# Patient Record
Sex: Female | Born: 1970 | Race: White | Hispanic: No | Smoking: Current every day smoker
Health system: Southern US, Community
[De-identification: ages and names within clinical notes are randomized; demographics above are authoritative.]

## PROBLEM LIST (undated history)

## (undated) DIAGNOSIS — T7840XA Allergy, unspecified, initial encounter: Secondary | ICD-10-CM

## (undated) DIAGNOSIS — E119 Type 2 diabetes mellitus without complications: Secondary | ICD-10-CM

## (undated) DIAGNOSIS — E079 Disorder of thyroid, unspecified: Secondary | ICD-10-CM

## (undated) DIAGNOSIS — J45909 Unspecified asthma, uncomplicated: Secondary | ICD-10-CM

## (undated) DIAGNOSIS — I639 Cerebral infarction, unspecified: Secondary | ICD-10-CM

## (undated) DIAGNOSIS — J449 Chronic obstructive pulmonary disease, unspecified: Secondary | ICD-10-CM

## (undated) DIAGNOSIS — K219 Gastro-esophageal reflux disease without esophagitis: Secondary | ICD-10-CM

## (undated) DIAGNOSIS — I10 Essential (primary) hypertension: Secondary | ICD-10-CM

## (undated) HISTORY — DX: Allergy, unspecified, initial encounter: T78.40XA

## (undated) HISTORY — DX: Essential (primary) hypertension: I10

## (undated) HISTORY — DX: Cerebral infarction, unspecified: I63.9

## (undated) HISTORY — PX: NASAL SINUS SURGERY: SHX719

## (undated) HISTORY — DX: Type 2 diabetes mellitus without complications: E11.9

## (undated) HISTORY — DX: Gastro-esophageal reflux disease without esophagitis: K21.9

## (undated) HISTORY — DX: Disorder of thyroid, unspecified: E07.9

## (undated) HISTORY — PX: BILATERAL KNEE ARTHROSCOPY: SUR91

## (undated) HISTORY — PX: OTHER SURGICAL HISTORY: SHX169

## (undated) HISTORY — DX: Unspecified asthma, uncomplicated: J45.909

---
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
## 2016-09-16 HISTORY — PX: ABDOMINAL HYSTERECTOMY: SHX81

## 2017-09-21 DIAGNOSIS — T783XXA Angioneurotic edema, initial encounter: Secondary | ICD-10-CM | POA: Diagnosis not present

## 2017-09-21 DIAGNOSIS — R079 Chest pain, unspecified: Secondary | ICD-10-CM | POA: Diagnosis not present

## 2017-09-22 DIAGNOSIS — R079 Chest pain, unspecified: Secondary | ICD-10-CM | POA: Diagnosis not present

## 2017-09-22 DIAGNOSIS — T783XXA Angioneurotic edema, initial encounter: Secondary | ICD-10-CM | POA: Diagnosis not present

## 2018-12-26 DIAGNOSIS — R079 Chest pain, unspecified: Secondary | ICD-10-CM

## 2018-12-26 DIAGNOSIS — E876 Hypokalemia: Secondary | ICD-10-CM | POA: Diagnosis not present

## 2018-12-27 DIAGNOSIS — E876 Hypokalemia: Secondary | ICD-10-CM | POA: Diagnosis not present

## 2018-12-27 DIAGNOSIS — R079 Chest pain, unspecified: Secondary | ICD-10-CM | POA: Diagnosis not present

## 2018-12-28 DIAGNOSIS — E876 Hypokalemia: Secondary | ICD-10-CM | POA: Diagnosis not present

## 2018-12-28 DIAGNOSIS — R079 Chest pain, unspecified: Secondary | ICD-10-CM | POA: Diagnosis not present

## 2020-02-07 ENCOUNTER — Encounter: Payer: Self-pay | Admitting: Physical Medicine and Rehabilitation

## 2020-02-23 ENCOUNTER — Encounter: Payer: 59 | Attending: Physical Medicine and Rehabilitation | Admitting: Physical Medicine and Rehabilitation

## 2020-02-23 ENCOUNTER — Other Ambulatory Visit: Payer: Self-pay

## 2020-02-23 ENCOUNTER — Encounter: Payer: Self-pay | Admitting: Physical Medicine and Rehabilitation

## 2020-02-23 VITALS — BP 144/86 | HR 68 | Temp 97.5°F | Ht 65.5 in | Wt 225.8 lb

## 2020-02-23 DIAGNOSIS — M5440 Lumbago with sciatica, unspecified side: Secondary | ICD-10-CM | POA: Insufficient documentation

## 2020-02-23 DIAGNOSIS — M545 Low back pain: Secondary | ICD-10-CM | POA: Diagnosis not present

## 2020-02-23 DIAGNOSIS — R29898 Other symptoms and signs involving the musculoskeletal system: Secondary | ICD-10-CM | POA: Insufficient documentation

## 2020-02-23 DIAGNOSIS — G8929 Other chronic pain: Secondary | ICD-10-CM | POA: Insufficient documentation

## 2020-02-23 MED ORDER — METAXALONE 800 MG PO TABS: 800.0000 mg | ORAL_TABLET | Freq: Three times a day (TID) | ORAL | 5 refills | Status: DC | PRN

## 2020-02-23 MED ORDER — DULOXETINE HCL 30 MG PO CPEP
30.0000 mg | ORAL_CAPSULE | Freq: Every day | ORAL | 3 refills | Status: DC
Start: 2020-02-23 — End: 2020-05-24
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
## 2020-02-23 NOTE — Patient Instructions (Signed)
Pt is a 49 yr old female with DM A1c of 7.4, hypothyroidism- TSH of 5.4, and HTN and tobacco abuse- 1ppd- here for chronic low back pain.    1. Has tried and failed Flexeril in past- and Baclofen- both are sedating and not real helpful . Will d/c Baclofen and start Skelaxin/Metaxalone- 800 mg up to 3x/day as needed (suggest taking 4x/week/ on bad days).   2. Wean Gabapentin-  Doesn't notice a difference if misses pills.  - decrease to 2 tabs of 300 mg (600) 3x/day x 1 week then 300 mg 3x/day x 1 week, then 1 tab nightly x 3-4 days, then stop.   3.  Duloxetine /Cymbalta 30 mg nightly x 1 week  Then 60 mg nightly- for nerve pain  1% of patients can have nausea with Duloxetine- call me if needs an anti-nausea medicine. Can also cause mild dry mouth/dry eyes and mild constipation.  4. If not effective, will change to Lyrica.   5. I suggest if fatigue and hair loss and dry skin don't improve pretty fast, talk to PCP about that with thyroid.   6. Referral to PT- [place] for PT only- outpatient.   7. F/U in 6 weeks- call if issues earlier.

## 2020-04-05 ENCOUNTER — Other Ambulatory Visit: Payer: Self-pay

## 2020-04-05 ENCOUNTER — Encounter: Payer: Self-pay | Admitting: Physical Medicine and Rehabilitation

## 2020-04-05 ENCOUNTER — Encounter: Payer: 59 | Attending: Physical Medicine and Rehabilitation | Admitting: Physical Medicine and Rehabilitation

## 2020-04-05 VITALS — BP 126/86 | HR 84 | Temp 98.3°F | Ht 65.5 in | Wt 214.0 lb

## 2020-04-05 DIAGNOSIS — M62838 Other muscle spasm: Secondary | ICD-10-CM

## 2020-04-05 DIAGNOSIS — G8929 Other chronic pain: Secondary | ICD-10-CM

## 2020-04-05 DIAGNOSIS — M792 Neuralgia and neuritis, unspecified: Secondary | ICD-10-CM

## 2020-04-05 DIAGNOSIS — R29898 Other symptoms and signs involving the musculoskeletal system: Secondary | ICD-10-CM | POA: Insufficient documentation

## 2020-04-05 DIAGNOSIS — M545 Low back pain: Secondary | ICD-10-CM

## 2020-04-05 MED ORDER — PREGABALIN 50 MG PO CAPS: 50.0000 mg | ORAL_CAPSULE | Freq: Three times a day (TID) | ORAL | 5 refills | Status: DC

## 2020-04-05 MED ORDER — ORPHENADRINE CITRATE ER 100 MG PO TB12
100.0000 mg | ORAL_TABLET | Freq: Two times a day (BID) | ORAL | 5 refills | Status: DC | PRN
Start: 2020-04-05 — End: 2020-12-20

## 2020-04-05 MED ORDER — LEVOTHYROXINE SODIUM 75 MCG PO TABS
75.0000 ug | ORAL_TABLET | Freq: Every day | ORAL | 5 refills | Status: DC
Start: 2020-04-05 — End: 2020-10-18

## 2020-04-05 NOTE — Patient Instructions (Addendum)
1. Wean off Duloxetine- 30 mg daily x 1 week, then stop- if during this, pain gets worse, can restart 60 mg daily- just in case.    2. Lyrica (is off Gabapentin)-  50 mg 3x/day x 1 week, then 100 mg 3x/day- for low back pain, nerve pain - can take 1 tab in AM and 2 tabs at night, or just 3x/day.  Can cause LE swelling (different mechanism), sluggish,sleepy and depression in rare cases.   3. Starts PT next week on 7/26-focus on doing 5 DAYS/week  4.  Orphenadrine/Norflex 100 mg 2x/day AS NEEDED for muscle relaxant. - also can cause sleepiness.  5. Synthroid 75 mcg daily-for thyroid replacement- stop Euthyrox.    6. F/U in 6-8 weeks

## 2020-04-05 NOTE — Progress Notes (Addendum)
Subjective:    Patient ID: Kimberly Davila, female    DOB: 16-Oct-1970, 49 y.o.   MRN: [id number]  HPI   Pt is a 49 yr old female with DM A1c of 7.4, hypothyroidism- TSH of 5.4, and HTN and tobacco abuse- 1ppd- here for chronic low back pain.  Here for f/u today.   On Euthyrox 25 mcg daily- but on LOW dose and didn't start Synthroid - which is unusual.      Got Duloxetine- but insurance denied Skelaxin completely, even with preauthorization.   No side effects from Duloxetine-  Taking 2 capsules/day- so 60 mg daily- so 6 weeks since started it.   Thinks had an MRI done-has CD- no report.    Had severe LE swelling from Gabapentin- swelled "huge" and hurt really bad.   Pain didn't get worse when came off Gabapentin.   Dr Beverely Pace is back doctor- did epidural steroid injections- 8+ times and no improvement.   Has had tramadol in past- never did anything for her.     Pain Inventory Average Pain 9 Pain Right Now 8 My pain is constant, sharp, burning and aching  In the last 24 hours, has pain interfered with the following? General activity 4 Relation with others 2 Enjoyment of life 6 What TIME of day is your pain at its worst? All the time around the clock. Sleep (in general) Poor  Pain is worse with: walking, bending, sitting and standing Pain improves with: medication Relief from Meds: 3  Mobility walk without assistance how many minutes can you walk? 5-10 mins ability to climb steps?  no do you drive?  yes  Function what is your job? Out of work since [place]. I need assistance with the following:  household duties Do you have any goals in this area?  yes  Neuro/Psych trouble walking spasms  Prior Studies Any changes since last visit?  no  Physicians involved in your care Any changes since last visit?  no   History reviewed. No pertinent family history. Social History   Socioeconomic History  . Marital status: Divorced    Spouse name: Not on file   . Number of children: Not on file  . Years of education: Not on file  . Highest education level: Not on file  Occupational History  . Not on file  Tobacco Use  . Smoking status: Current Every Day Smoker    Packs/day: 1.00    Years: 26.00    Pack years: 26.00    Types: Cigarettes  . Smokeless tobacco: Never Used  Substance and Sexual Activity  . Alcohol use: Not on file  . Drug use: Not on file  . Sexual activity: Not on file  Other Topics Concern  . Not on file  Social History Narrative  . Not on file   Social Determinants of Health   Financial Resource Strain:   . Difficulty of Paying Living Expenses:   Food Insecurity:   . Worried About Charity fundraiser in the Last Year:   . Arboriculturist in the Last Year:   Transportation Needs:   . Film/video editor (Medical):   Marland Kitchen Lack of Transportation (Non-Medical):   Physical Activity:   . Days of Exercise per Week:   . Minutes of Exercise per Session:   Stress:   . Feeling of Stress :   Social Connections:   . Frequency of Communication with Friends and Family:   . Frequency of Social Gatherings with Friends  and Family:   . Attends Religious Services:   . Active Member of Clubs or Organizations:   . Attends Archivist Meetings:   Marland Kitchen Marital Status:    Past Surgical History:  Procedure Laterality Date  . ABDOMINAL HYSTERECTOMY  2018  . BILATERAL KNEE ARTHROSCOPY    . CHOLECYSTECTOMY  1991   Past Medical History:  Diagnosis Date  . Allergy   . Diabetes mellitus without complication ([place])   . Hypertension   . Thyroid disease    BP 126/86   Pulse 84   Temp 98.3 F (36.8 C)   Ht 5' 5.5" (1.664 m)   Wt 214 lb (97.1 kg)   SpO2 95%   BMI 35.07 kg/m   Opioid Risk Score:   Fall Risk Score:  `1  Depression screen PHQ 2/9  Depression screen PHQ 2/9 02/23/2020  Decreased Interest 3  Down, Depressed, Hopeless 1  PHQ - 2 Score 4  Altered sleeping 3  Tired, decreased energy 2  Change in appetite  1  Feeling bad or failure about yourself  2  Trouble concentrating 1  Moving slowly or fidgety/restless 0  Suicidal thoughts 0  PHQ-9 Score 13    Review of Systems  Constitutional: Negative.   HENT: Negative.   Eyes: Negative.   Cardiovascular: Negative.   Gastrointestinal: Negative.   Endocrine: Negative.   Genitourinary: Negative.   Musculoskeletal: Positive for gait problem.       Spasms  Skin: Negative.   Allergic/Immunologic: Negative.   Hematological: Negative.   Psychiatric/Behavioral: Negative.        Objective:   Physical Exam   Awake, alert, appropriate, wearing mask, sitting on table, NAD Downcast expression- keeping very still due to pain.      Assessment & Plan:   Pt is a 49 yr old female with DM A1c of 7.4. diabetic neuropathy,, hypothyroidism- TSH of 5.4, and HTN and tobacco abuse- 1ppd- here for chronic low back pain.  Here for f/u today   1. Wean off Duloxetine- 30 mg daily x 1 week, then stop- if during this, pain gets worse, can restart 60 mg daily- just in case.    2. Lyrica (is off Gabapentin)-  50 mg 3x/day x 1 week, then 100 mg 3x/day- for low back pain, nerve pain - can take 1 tab in AM and 2 tabs at night, or just 3x/day.  Can cause LE swelling (different mechanism), sluggish,sleepy and depression in rare cases.  Also spoke to pt again when said insurance only covers Lyrica for diabetic neuropathy- she explained she has DM- type II- A1c 7.6 and also c/o burning, numbness/tingling in feet/legs- so meets criteria for diabetic neuropathy as well as chronic back pain with radiculopathy - will submit appeal back to insurance for Lyrica to be covered- since she meets the insurance criteria for this medication.   3. Starts PT next week on 7/26-  4.  Orphenadrine/Norflex 100 mg 2x/day AS NEEDED for muscle relaxant. - also can cause sleepiness.   5. F/U in 6-8 weeks  6.  Synthroid 75 mcg daily-for thyroid replacement- stop Euthyrox.   I spent a  total of 25 minutes on appointment- as detailed above.

## 2020-04-10 ENCOUNTER — Ambulatory Visit: Payer: 59 | Attending: Physical Medicine and Rehabilitation | Admitting: Physical Therapy

## 2020-04-10 ENCOUNTER — Other Ambulatory Visit: Payer: Self-pay

## 2020-04-10 DIAGNOSIS — M62830 Muscle spasm of back: Secondary | ICD-10-CM | POA: Insufficient documentation

## 2020-04-10 DIAGNOSIS — M6281 Muscle weakness (generalized): Secondary | ICD-10-CM | POA: Diagnosis present

## 2020-04-10 DIAGNOSIS — G8929 Other chronic pain: Secondary | ICD-10-CM | POA: Diagnosis present

## 2020-04-10 DIAGNOSIS — R262 Difficulty in walking, not elsewhere classified: Secondary | ICD-10-CM | POA: Diagnosis present

## 2020-04-10 DIAGNOSIS — M545 Low back pain, unspecified: Secondary | ICD-10-CM

## 2020-04-10 NOTE — Patient Instructions (Addendum)
    Home exercise program created by Kayden Amend, PT.  For questions, please contact Makinsley Schiavi via phone at [phone] or email at [email or web address]  [facility] [place] [address]  [address] [place], [place], [address] Phone: [phone]   Fax:  [phone]   Trigger Point Dry Needling  . What is Trigger Point Dry Needling (DN)? o DN is a physical therapy technique used to treat muscle pain and dysfunction. Specifically, DN helps deactivate muscle trigger points (muscle knots).  o A thin filiform needle is used to penetrate the skin and stimulate the underlying trigger point. The goal is for a local twitch response (LTR) to occur and for the trigger point to relax. No medication of any kind is injected during the procedure.   . What Does Trigger Point Dry Needling Feel Like?  o The procedure feels different for each individual patient. Some patients report that they do not actually feel the needle enter the skin and overall the process is not painful. Very mild bleeding may occur. However, many patients feel a deep cramping in the muscle in which the needle was inserted. This is the local twitch response.   . How Will I feel after the treatment? o Soreness is normal, and the onset of soreness may not occur for a few hours. Typically this soreness does not last longer than two days.  o Bruising is uncommon, however; ice can be used to decrease any possible bruising.  o In rare cases feeling tired or nauseous after the treatment is normal. In addition, your symptoms may get worse before they get better, this period will typically not last longer than 24 hours.   . What Can I do After My Treatment? o Increase your hydration by drinking more water for the next 24 hours. o You may place ice or heat on the areas treated that have become sore, however, do not use heat on inflamed or bruised areas. Heat often brings more relief post  needling. o You can continue your regular activities, but vigorous activity is not recommended initially after the treatment for 24 hours. o DN is best combined with other physical therapy such as strengthening, stretching, and other therapies.   
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
## 2020-04-11 ENCOUNTER — Telehealth: Payer: Self-pay | Admitting: *Deleted

## 2020-04-11 NOTE — Telephone Encounter (Signed)
Received incoming fax from [employer]  Prior authorization for pregabalin DENIED.  Denial letter indicates post-herpetic neuralgia and diabetic neuropathy are the only acceptable clinical indications.

## 2020-04-13 NOTE — Telephone Encounter (Signed)
Spoke to pt- made changes to note from 7/21- so should be able to submit to insurance- call me if questions- ML

## 2020-04-17 ENCOUNTER — Other Ambulatory Visit: Payer: Self-pay

## 2020-04-17 ENCOUNTER — Ambulatory Visit: Payer: 59 | Attending: Physical Medicine and Rehabilitation | Admitting: Physical Therapy

## 2020-04-17 DIAGNOSIS — M545 Low back pain: Secondary | ICD-10-CM | POA: Insufficient documentation

## 2020-04-17 DIAGNOSIS — M6281 Muscle weakness (generalized): Secondary | ICD-10-CM

## 2020-04-17 DIAGNOSIS — G8929 Other chronic pain: Secondary | ICD-10-CM

## 2020-04-17 DIAGNOSIS — R262 Difficulty in walking, not elsewhere classified: Secondary | ICD-10-CM | POA: Diagnosis present

## 2020-04-17 DIAGNOSIS — M62830 Muscle spasm of back: Secondary | ICD-10-CM

## 2020-04-17 NOTE — Therapy (Signed)
[facility] [address]  [place], [place], [address] Phone: [phone]   Fax:  [phone]  Physical Therapy Treatment  Patient Details  Name: Kimberly Davila MRN: [id number] Date of Birth: 1971/07/11 Referring Provider (PT): Courtney Heys, MD   Encounter Date: 04/17/2020   PT End of Session - 04/17/20 1033    Visit Number 2    Number of Visits 12    Date for PT Re-Evaluation 05/22/20    Authorization Type [employer]    PT Start Time 1031   Pt arrived late   PT Stop Time 1121    PT Time Calculation (min) 50 min    Activity Tolerance Patient tolerated treatment well;Patient limited by pain    Behavior During Therapy [facility] for tasks assessed/performed           Past Medical History:  Diagnosis Date   Allergy    Diabetes mellitus without complication ([place])    Hypertension    Thyroid disease     Past Surgical History:  Procedure Laterality Date   ABDOMINAL HYSTERECTOMY  2018   BILATERAL KNEE ARTHROSCOPY     CHOLECYSTECTOMY  1991    There were no vitals filed for this visit.   Subjective Assessment - 04/17/20 1034    Subjective Pt reporting increased pain following HEP - she thinks it may be from the seated prayer stretches.    Patient Stated Goals "stop having pain"    Currently in Pain? Yes    Pain Score 6     Pain Location Back    Pain Orientation Lower    Pain Descriptors / Indicators Aching;Burning    Pain Type Chronic pain    Pain Frequency Constant                             [facility] Adult PT Treatment/Exercise - 04/17/20 1031      Self-Care   Self-Care Posture    Posture Provided education in typical daily positoning, mobility and daily tasks to minimize strain on lumbar spine - pt noting seh already uses some of techniques detailed but did note some good ideas she would like to try.      Exercises   Exercises Lumbar      Lumbar Exercises: Stretches    Passive Hamstring Stretch Right;Left;30 seconds;1 rep    Passive Hamstring Stretch Limitations supine with strap    Single Knee to Chest Stretch Right;Left;30 seconds;1 rep    Single Knee to Chest Stretch Limitations opp knee flexed    Piriformis Stretch Right;Left;30 seconds;1 rep    Piriformis Stretch Limitations supine KTOS   reports pain in tailbone with stretch on R     Lumbar Exercises: Aerobic   Nustep L4 x 4 min (UE/LE)   seat #7     Lumbar Exercises: Supine   Pelvic Tilt 10 reps;5 seconds      Modalities   Modalities Electrical Stimulation;Moist Heat      Moist Heat Therapy   Number Minutes Moist Heat 15 Minutes    Moist Heat Location Lumbar Spine      Electrical Stimulation   Electrical Stimulation Location Lumbar paraspinals    Electrical Stimulation Action IFC    Electrical Stimulation Parameters 80-150 Hz, intensity to pt tol x 15'    Electrical Stimulation Goals Pain;Tone                  PT  Education - 04/17/20 1100    Education Details Posture & body mechanics education    Person(s) Educated Patient    Methods Explanation;Demonstration;Handout    Comprehension Verbalized understanding;Need further instruction            PT Short Term Goals - 04/17/20 1034      PT SHORT TERM GOAL #1   Title Patient will be independent with initial HEP    Status On-going    Target Date 04/24/20      PT SHORT TERM GOAL #2   Title Patient will verbalize/demonstrate good awareness of neutral spine posture and proper body mechanics for daily tasks    Status On-going    Target Date 05/01/20      PT SHORT TERM GOAL #3   Title Patient to improve tissue quality as noted by reduced tissue tightness and tenderness to palpation    Status On-going    Target Date 05/01/20             PT Long Term Goals - 04/17/20 1044      PT LONG TERM GOAL #1   Title Patient will be independent with ongoing/advanced HEP    Status On-going    Target Date 05/22/20      PT  LONG TERM GOAL #2   Title Patient to demonstrate ability to achieve and maintain good spinal alignment/posturing    Status On-going    Target Date 05/22/20      PT LONG TERM GOAL #3   Title Patient to report pain reduction in frequency and intensity by >/= 50%    Status On-going    Target Date 05/22/20      PT LONG TERM GOAL #4   Title Patient to improve lumbar AROM to [facility] without pain provocation    Status On-going    Target Date 05/22/20      PT LONG TERM GOAL #5   Title Patient will demonstrate improved B proximal LE strength to >/= 4+/5 for improved stability and ease of mobility    Status On-going    Target Date 05/22/20      PT LONG TERM GOAL #6   Title Patient to report ability to perform ADLs and household tasks/chores without increased pain >3/10    Status On-going    Target Date 05/22/20                 Plan - 04/17/20 1044    Clinical Impression Statement Kimberly Davila reports increased pain with HEP - she is uncertain which exercise is triggering the increase in pain but thinks it may be resulting from seated prayer stretches. HEP reviewed excluding seated prayer stretches with no increased pain reported with all supine exercises other than KTOS on R which caused increased pain in tailbone. SIJ assessed for asymmetry but alignment appears WNL. Session limited due to patients late arrival, with remainder of session focusing on education in proper posture and body mechanics for typical daily positioning and tasks.    Personal Factors and Comorbidities Time since onset of injury/illness/exacerbation;Past/Current Experience;Fitness;Comorbidity 3+    Comorbidities DM, HTN, thyroid disease, B knee arthroscopy    Examination-Activity Limitations Bed Mobility;Bend;Lift;Carry;Dressing;Hygiene/Grooming;Locomotion Level;Sit;Sleep;Squat;Stairs;Stand;Transfers    Examination-Participation Restrictions Cleaning;Community Activity;Driving;Laundry;Meal Prep;Shop;Yard Work;Other   out  of work since injury   Rehab Potential Good    PT Frequency 2x / week    PT Duration 6 weeks    PT Treatment/Interventions ADLs/Self Care Home Management;Cryotherapy;Electrical Stimulation;Iontophoresis 4mg /ml Dexamethasone;Moist Heat;Traction;Ultrasound;Gait training;Functional mobility training;Therapeutic activities;Therapeutic exercise;Neuromuscular re-education;Patient/family  education;Manual techniques;Passive range of motion;Dry needling;Taping;Spinal Manipulations    PT Next Visit Plan manual therapy including possible DN to address increase muscle tension/tightness and pain; lumbopelvic flexibility & strengthening; posture and body mechanics training review PRN; modalities PRN including review of home TENS setup if pt brings unit    PT Home Exercise Plan 7/26 - seated 3-way prayer stretch, supine SKTC, piriformis and HS stretches, pelvic tilt    Consulted and Agree with Plan of Care Patient           Patient will benefit from skilled therapeutic intervention in order to improve the following deficits and impairments:  Abnormal gait, Decreased activity tolerance, Decreased endurance, Decreased mobility, Decreased knowledge of precautions, Decreased range of motion, Decreased safety awareness, Decreased strength, Difficulty walking, Hypomobility, Improper body mechanics, Postural dysfunction, Pain, Impaired perceived functional ability  Visit Diagnosis: Chronic bilateral low back pain without sciatica  Muscle spasm of back  Muscle weakness (generalized)  Difficulty in walking, not elsewhere classified     Problem List Patient Active Problem List   Diagnosis Date Noted   Nerve pain 04/05/2020   Muscle spasms of both lower extremities 04/05/2020   Left leg weakness 02/23/2020    Percival Spanish, PT, MPT 04/17/2020, 4:23 PM  [facility] [address]  [place], [place], [address] Phone: [phone]   Fax:   [phone]  Name: Kimberly Davila MRN: [id number] Date of Birth: 1970-11-18

## 2020-04-17 NOTE — Patient Instructions (Signed)

## 2020-04-20 ENCOUNTER — Ambulatory Visit: Payer: 59

## 2020-04-20 ENCOUNTER — Other Ambulatory Visit: Payer: Self-pay

## 2020-04-20 DIAGNOSIS — G8929 Other chronic pain: Secondary | ICD-10-CM

## 2020-04-20 DIAGNOSIS — M6281 Muscle weakness (generalized): Secondary | ICD-10-CM

## 2020-04-20 DIAGNOSIS — M545 Low back pain: Secondary | ICD-10-CM | POA: Diagnosis not present

## 2020-04-20 DIAGNOSIS — R262 Difficulty in walking, not elsewhere classified: Secondary | ICD-10-CM

## 2020-04-20 DIAGNOSIS — M62830 Muscle spasm of back: Secondary | ICD-10-CM
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
## 2020-04-24 ENCOUNTER — Other Ambulatory Visit: Payer: Self-pay

## 2020-04-24 ENCOUNTER — Ambulatory Visit: Payer: 59 | Admitting: Physical Therapy

## 2020-04-24 ENCOUNTER — Encounter: Payer: Self-pay | Admitting: Physical Therapy

## 2020-04-24 DIAGNOSIS — M545 Low back pain, unspecified: Secondary | ICD-10-CM

## 2020-04-24 DIAGNOSIS — M62830 Muscle spasm of back: Secondary | ICD-10-CM

## 2020-04-24 DIAGNOSIS — R262 Difficulty in walking, not elsewhere classified: Secondary | ICD-10-CM

## 2020-04-24 DIAGNOSIS — M6281 Muscle weakness (generalized): Secondary | ICD-10-CM

## 2020-04-24 NOTE — Patient Instructions (Signed)
   Kinesiology tape  What is kinesiology tape?  There are many brands of kinesiology tape. KTape, Rock Tape, Body Sport, Dynamic tape, to name a few.  It is an elasticized tape designed to support the body's natural healing process. This tape provides stability and support to muscles and joints without restricting motion.  It can also help decrease swelling in the area of application.  How does it work?  The tape microscopically lifts and decompresses the skin to allow for drainage of lymph (swelling) to flow away from area, reducing inflammation. The tape has the ability to help re-educate the neuromuscular system by targeting specific receptors in the skin. The presence of the tape increases the body's awareness of posture and body mechanics.  Do not use with:  . Open wounds . Skin lesions . Adhesive allergies  In some rare cases, mild/moderate skin irritation can occur. This can include redness, itchiness, or hives. If this occurs, immediately remove tape and consult your primary care physician if symptoms are severe or do not resolve within 2 days.  Safe removal of the tape:  To remove tape safely, hold nearby skin with one hand and gentle roll tape down with other hand. You can apply oil or conditioner to tape while in shower prior to removal to loosen adhesive. DO NOT swiftly rip tape off like a band-aid, as this could cause skin tears and additional skin irritation.     For questions, please contact your therapist at:  [facility] [place] [address]  [address] [place], [place], [address] Phone: [phone]   Fax:  [phone]     

## 2020-04-24 NOTE — Telephone Encounter (Signed)
Prior Authorization approved from 04/24/2020-04/24/2021
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
## 2020-04-27 ENCOUNTER — Ambulatory Visit: Payer: 59

## 2020-04-27 ENCOUNTER — Other Ambulatory Visit: Payer: Self-pay

## 2020-04-27 DIAGNOSIS — R262 Difficulty in walking, not elsewhere classified: Secondary | ICD-10-CM

## 2020-04-27 DIAGNOSIS — M545 Low back pain: Secondary | ICD-10-CM | POA: Diagnosis not present

## 2020-04-27 DIAGNOSIS — G8929 Other chronic pain: Secondary | ICD-10-CM

## 2020-04-27 DIAGNOSIS — M62830 Muscle spasm of back: Secondary | ICD-10-CM

## 2020-04-27 DIAGNOSIS — M6281 Muscle weakness (generalized): Secondary | ICD-10-CM
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
## 2020-04-27 NOTE — Patient Instructions (Signed)
TENS stands for Transcutaneous Electrical Nerve Stimulation. In other words, electrical impulses are allowed to pass through the skin in order to excite a nerve.   Purpose and Use of TENS:  TENS is a method used to manage acute and chronic pain without the use of drugs. It has been effective in managing pain associated with surgery, sprains, strains, trauma, rheumatoid arthritis, and neuralgias. It is a non-addictive, low risk, and non-invasive technique used to control pain. It is not, by any means, a curative form of treatment.   How TENS Works:  Most TENS units are a Paramedic unit powered by one 9 volt battery. Attached to the outside of the unit are two lead wires where two pins and/or snaps connect on each wire. All units come with a set of four reusable pads or electrodes. These are placed on the skin surrounding the area involved. By inserting the leads into  the pads, the electricity can pass from the unit making the circuit complete.  As the intensity is turned up slowly, the electrical current enters the body from the electrodes through the skin to the surrounding nerve fibers. This triggers the release of hormones from within the body. These hormones contain pain relievers. By increasing the circulation of these hormones, the persons pain may be lessened. It is also believed that the electrical stimulation itself helps to block the pain messages being sent to the brain, thus also decreasing the bodys perception of pain.   Hazards:  TENS units are NOT to be used by patients with PACEMAKERS, DEFIBRILLATORS, DIABETIC PUMPS, PREGNANT WOMEN, and patients with SEIZURE DISORDERS.  TENS units are NOT to be used over the heart, throat, brain, or spinal cord.  One of the major side effects from the TENS unit may be skin irritation. Some people may develop a rash if they are sensitive to the materials used in the electrodes or the connecting wires.   Wear the unit for 15 min.   Avoid  overuse due the body getting used to the stem making it not as effective over time.

## 2020-05-02 ENCOUNTER — Other Ambulatory Visit: Payer: Self-pay

## 2020-05-02 ENCOUNTER — Ambulatory Visit: Payer: 59

## 2020-05-02 DIAGNOSIS — M6281 Muscle weakness (generalized): Secondary | ICD-10-CM

## 2020-05-02 DIAGNOSIS — G8929 Other chronic pain: Secondary | ICD-10-CM

## 2020-05-02 DIAGNOSIS — M545 Low back pain, unspecified: Secondary | ICD-10-CM

## 2020-05-02 DIAGNOSIS — M62830 Muscle spasm of back: Secondary | ICD-10-CM

## 2020-05-02 DIAGNOSIS — R262 Difficulty in walking, not elsewhere classified: Secondary | ICD-10-CM
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
## 2020-05-04 ENCOUNTER — Encounter: Payer: 59 | Admitting: Physical Therapy

## 2020-05-24 ENCOUNTER — Encounter: Payer: Self-pay | Admitting: Physical Medicine and Rehabilitation

## 2020-05-24 ENCOUNTER — Encounter: Payer: 59 | Attending: Physical Medicine and Rehabilitation | Admitting: Physical Medicine and Rehabilitation

## 2020-05-24 ENCOUNTER — Other Ambulatory Visit: Payer: Self-pay

## 2020-05-24 VITALS — BP 122/82 | HR 72 | Temp 98.5°F | Ht 65.5 in | Wt 215.6 lb

## 2020-05-24 DIAGNOSIS — M62838 Other muscle spasm: Secondary | ICD-10-CM

## 2020-05-24 DIAGNOSIS — M545 Low back pain: Secondary | ICD-10-CM | POA: Diagnosis not present

## 2020-05-24 DIAGNOSIS — M792 Neuralgia and neuritis, unspecified: Secondary | ICD-10-CM | POA: Diagnosis not present

## 2020-05-24 DIAGNOSIS — R29898 Other symptoms and signs involving the musculoskeletal system: Secondary | ICD-10-CM

## 2020-05-24 DIAGNOSIS — G8929 Other chronic pain: Secondary | ICD-10-CM | POA: Insufficient documentation

## 2020-05-24 MED ORDER — LEVETIRACETAM 500 MG PO TABS: 500.0000 mg | ORAL_TABLET | Freq: Two times a day (BID) | ORAL | 5 refills | Status: DC

## 2020-05-24 NOTE — Patient Instructions (Signed)
1. Think pt will have care for awhile.- at least another 1 year.    2. Keppra- 500 mg 2x/day x 1 week, then 1000 mg 2x/day- for back pain-   3. 3% can have irritability- 50% can respond well to Vitamin B complex (over the counter)- take as directed on bottle. If gets irritable.   4. Discussed opiates- will wait sinc will not treat the burning part of pain.   5. Discussed options of Trileptal and low dose naltrexone as well- decided upon Keppra for now.   6. If need be, will do any prior authorization needed.   7. F/U in 4 weeks- call if any issues. [phone]- - if need to change meds, can do on phone.
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
## 2020-05-30 ENCOUNTER — Encounter: Payer: Self-pay | Admitting: Physician Assistant

## 2020-06-21 ENCOUNTER — Other Ambulatory Visit: Payer: Self-pay

## 2020-06-21 ENCOUNTER — Encounter: Payer: 59 | Attending: Physical Medicine and Rehabilitation | Admitting: Physical Medicine and Rehabilitation

## 2020-06-21 ENCOUNTER — Encounter: Payer: Self-pay | Admitting: Physical Medicine and Rehabilitation

## 2020-06-21 VITALS — BP 121/78 | HR 82 | Temp 98.5°F | Ht 65.5 in | Wt 213.4 lb

## 2020-06-21 DIAGNOSIS — M5440 Lumbago with sciatica, unspecified side: Secondary | ICD-10-CM | POA: Diagnosis not present

## 2020-06-21 DIAGNOSIS — M62838 Other muscle spasm: Secondary | ICD-10-CM | POA: Diagnosis not present

## 2020-06-21 DIAGNOSIS — G8929 Other chronic pain: Secondary | ICD-10-CM | POA: Diagnosis not present

## 2020-06-21 DIAGNOSIS — M545 Low back pain, unspecified: Secondary | ICD-10-CM | POA: Diagnosis present

## 2020-06-21 DIAGNOSIS — E1140 Type 2 diabetes mellitus with diabetic neuropathy, unspecified: Secondary | ICD-10-CM | POA: Diagnosis not present

## 2020-06-21 DIAGNOSIS — S01101A Unspecified open wound of right eyelid and periocular area, initial encounter: Secondary | ICD-10-CM

## 2020-06-21 DIAGNOSIS — Z79899 Other long term (current) drug therapy: Secondary | ICD-10-CM | POA: Diagnosis not present

## 2020-06-21 DIAGNOSIS — M792 Neuralgia and neuritis, unspecified: Secondary | ICD-10-CM

## 2020-06-21 DIAGNOSIS — I10 Essential (primary) hypertension: Secondary | ICD-10-CM | POA: Diagnosis not present

## 2020-06-21 DIAGNOSIS — E039 Hypothyroidism, unspecified: Secondary | ICD-10-CM | POA: Diagnosis not present

## 2020-06-21 DIAGNOSIS — F17210 Nicotine dependence, cigarettes, uncomplicated: Secondary | ICD-10-CM | POA: Diagnosis not present

## 2020-06-21 DIAGNOSIS — S0011XA Contusion of right eyelid and periocular area, initial encounter: Secondary | ICD-10-CM | POA: Insufficient documentation

## 2020-06-21 MED ORDER — LEVETIRACETAM 1000 MG PO TABS: 1000.0000 mg | ORAL_TABLET | Freq: Two times a day (BID) | ORAL | 5 refills | Status: DC
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
## 2020-06-29 ENCOUNTER — Ambulatory Visit (INDEPENDENT_AMBULATORY_CARE_PROVIDER_SITE_OTHER): Payer: 59 | Admitting: Physician Assistant

## 2020-06-29 ENCOUNTER — Encounter: Payer: Self-pay | Admitting: Physician Assistant

## 2020-06-29 DIAGNOSIS — K582 Mixed irritable bowel syndrome: Secondary | ICD-10-CM | POA: Diagnosis not present

## 2020-06-29 DIAGNOSIS — K219 Gastro-esophageal reflux disease without esophagitis: Secondary | ICD-10-CM | POA: Diagnosis not present

## 2020-06-29 DIAGNOSIS — Z1211 Encounter for screening for malignant neoplasm of colon: Secondary | ICD-10-CM | POA: Diagnosis not present

## 2020-06-29 DIAGNOSIS — R1013 Epigastric pain: Secondary | ICD-10-CM

## 2020-06-29 MED ORDER — OMEPRAZOLE 40 MG PO CPDR: 40.0000 mg | DELAYED_RELEASE_CAPSULE | Freq: Two times a day (BID) | ORAL | 3 refills | Status: AC

## 2020-06-29 MED ORDER — SUPREP BOWEL PREP KIT 17.5-3.13-1.6 GM/177ML PO SOLN: 1.0000 | ORAL | 0 refills | Status: DC

## 2020-06-29 NOTE — Progress Notes (Signed)
Chief Complaint: Abdominal pain, GERD  HPI:    Kimberly Davila is a 49 year old female with a past medical history as listed below, who was referred to me by Imagene Riches, NP for a complaint of abdominal pain and GERD.      Today, the patient explains that she had epigastric pain a few months ago that was so terrible and sharp, rated as a 10/10, that she could not eat for at least a month, this was regardless of using her Omeprazole 20 mg twice daily which she has been on for years.  Her PCP apparently increase this to 40 mg daily and patient tells me that after a month the pain seemed to go away and she has been able to eat over the past month now but does continue with reflux symptoms, especially if she forgets to take her medicine and sometimes at night which wakes her from her sleep choking.  This happens about 2 to 3 days a week.    Also complains of bowel movements which radiate back-and-forth from constipation to diarrhea, tells me she was diagnosed with IBS years ago.    Denies fever, chills, weight loss, change in bowel habits or blood in her stool.     Past Medical History:  Diagnosis Date  . Allergy   . Asthma   . Diabetes mellitus without complication ([place])   . Hypertension   . Thyroid disease     Past Surgical History:  Procedure Laterality Date  . ABDOMINAL HYSTERECTOMY  2018  . BILATERAL KNEE ARTHROSCOPY    . CHOLECYSTECTOMY  1991    Current Outpatient Medications  Medication Sig Dispense Refill  . albuterol (VENTOLIN HFA) 108 (90 Base) MCG/ACT inhaler INHALE 2 PUFFS BY MOUTH 4 TIMES DAILY FOR 5 DAYS THEN AS NEEDED    . amLODipine (NORVASC) 10 MG tablet Take 10 mg by mouth daily.    Marland Kitchen atorvastatin (LIPITOR) 10 MG tablet Take 10 mg by mouth daily.    . famotidine (PEPCID) 20 MG tablet Take 20 mg by mouth 2 (two) times daily.    Marland Kitchen glyBURIDE (DIABETA) 5 MG tablet Take 5 mg by mouth 2 (two) times daily.    . hydrochlorothiazide (HYDRODIURIL) 25 MG tablet Take 25 mg by  mouth daily.    Marland Kitchen levETIRAcetam (KEPPRA) 1000 MG tablet Take 1 tablet (1,000 mg total) by mouth 2 (two) times daily. For nerve pain 60 tablet 5  . levothyroxine (SYNTHROID) 75 MCG tablet Take 1 tablet (75 mcg total) by mouth daily before breakfast. 30 tablet 5  . losartan (COZAAR) 25 MG tablet Take 25 mg by mouth daily.    . magnesium oxide (MAG-OX) 400 MG tablet Take 1 tablet by mouth daily.    . metFORMIN (GLUCOPHAGE) 500 MG tablet Take 1 tablet by mouth 2 (two) times daily.    Marland Kitchen omeprazole (PRILOSEC) 20 MG capsule Take 10 mg by mouth daily.     . orphenadrine (NORFLEX) 100 MG tablet Take 1 tablet (100 mg total) by mouth 2 (two) times daily as needed for muscle spasms. 60 tablet 5  . VITAMIN D PO Take 5,000 Units by mouth daily.     No current facility-administered medications for this visit.    Allergies as of 06/29/2020 - Review Complete 06/29/2020  Allergen Reaction Noted  . Aspirin Anaphylaxis and Swelling 04/30/2016  . Sulfa antibiotics Rash 07/10/2015    Family History  Problem Relation Age of Onset  . Diabetes Mother   . Diabetes  Father   . Diabetes Maternal Grandmother     Social History   Socioeconomic History  . Marital status: Divorced    Spouse name: Not on file  . Number of children: Not on file  . Years of education: Not on file  . Highest education level: Not on file  Occupational History  . Not on file  Tobacco Use  . Smoking status: Current Every Day Smoker    Packs/day: 1.00    Years: 26.00    Pack years: 26.00    Types: Cigarettes  . Smokeless tobacco: Never Used  Vaping Use  . Vaping Use: Never used  Substance and Sexual Activity  . Alcohol use: Yes  . Drug use: Not Currently  . Sexual activity: Not on file  Other Topics Concern  . Not on file  Social History Narrative  . Not on file   Social Determinants of Health   Financial Resource Strain:   . Difficulty of Paying Living Expenses: Not on file  Food Insecurity:   . Worried About  Charity fundraiser in the Last Year: Not on file  . Ran Out of Food in the Last Year: Not on file  Transportation Needs:   . Lack of Transportation (Medical): Not on file  . Lack of Transportation (Non-Medical): Not on file  Physical Activity:   . Days of Exercise per Week: Not on file  . Minutes of Exercise per Session: Not on file  Stress:   . Feeling of Stress : Not on file  Social Connections:   . Frequency of Communication with Friends and Family: Not on file  . Frequency of Social Gatherings with Friends and Family: Not on file  . Attends Religious Services: Not on file  . Active Member of Clubs or Organizations: Not on file  . Attends Archivist Meetings: Not on file  . Marital Status: Not on file  Intimate Partner Violence:   . Fear of Current or Ex-Partner: Not on file  . Emotionally Abused: Not on file  . Physically Abused: Not on file  . Sexually Abused: Not on file    Review of Systems:    Constitutional: No weight loss, fever or chills Skin: No rash Cardiovascular: No chest pain  Respiratory: No SOB  Gastrointestinal: See HPI and otherwise negative Genitourinary: No dysuria  Neurological: No headache, dizziness or syncope Musculoskeletal: No new muscle or joint pain Hematologic: No bleeding  Psychiatric: No history of depression or anxiety   Physical Exam:  Vital signs: BP (!) 146/68   Pulse 72   Ht 5' 5.5" (1.664 m)   BMI 34.97 kg/m   Constitutional:   Pleasant Caucasian female appears to be in NAD, Well developed, Well nourished, alert and cooperative Head:  Normocephalic and atraumatic. Eyes:   PEERL, EOMI. No icterus. Conjunctiva pink. Ears:  Normal auditory acuity. Neck:  Supple Throat: Oral cavity and pharynx without inflammation, swelling or lesion.  Respiratory: Respirations even and unlabored. Lungs clear to auscultation bilaterally.   No wheezes, crackles, or rhonchi.  Cardiovascular: Normal S1, S2. No MRG. Regular rate and rhythm.  No peripheral edema, cyanosis or pallor.  Gastrointestinal:  Soft, nondistended, mild epigastric ttp. No rebound or guarding. Normal bowel sounds. No appreciable masses or hepatomegaly. Rectal:  Not performed.  Msk:  Symmetrical without gross deformities. Without edema, no deformity or joint abnormality.  Neurologic:  Alert and  oriented x4;  grossly normal neurologically.  Skin:   Dry and intact without significant lesions  or rashes. Psychiatric: Demonstrates good judgement and reason without abnormal affect or behaviors.  No recent labs or imaging.  Assessment: 1.  Epigastric pain: So terrible for a month that she could not eat, better now over the past month; consider relation to gastritis+/-PUD+/-H. pylori 2.  GERD: Continues regardless of Omeprazole 40 mg daily 3.  Screening for colorectal cancer: Patient is 77 and never had colonoscopy 4.  Mixed IBS  Plan: 1.  Scheduled patient for diagnostic EGD and a screening colonoscopy in the [place] with Dr. Silverio Decamp.  Did discuss risks, benefits, limitations and alternatives and the patient agrees to proceed.  Patient has had both of her Covid vaccines. 2.  Increased Omeprazole to 40 mg twice daily, 30-60 minutes before breakfast and dinner #60 with 3 refills. 3.  Reviewed antireflux diet and lifestyle modifications. 4.  Discussed an increase in fiber in her diet to 25 to 35 g with the use of a fiber supplement of some sort to help with her IBS which seems to radiate between constipation and diarrhea.  Also recommend she increase her water intake to the 6-8 eight ounce glasses of water per day. 5.  Patient to follow in clinic per recommendations from Dr. Silverio Decamp after time of procedure.  Ellouise Newer, PA-C [facility] 06/29/2020, 10:36 AM  Cc: Imagene Riches, NP

## 2020-06-29 NOTE — Patient Instructions (Addendum)
If you are age 48 or older, your body mass index should be between 23-30. Your Body mass index is 34.97 kg/m. If this is out of the aforementioned range listed, please consider follow up with your Primary Care Provider.  If you are age 28 or younger, your body mass index should be between 19-25. Your Body mass index is 34.97 kg/m. If this is out of the aformentioned range listed, please consider follow up with your Primary Care Provider.   You have been scheduled for an endoscopy and colonoscopy. Please follow the written instructions given to you at your visit today. Please pick up your prep supplies at the pharmacy within the next 1-3 days. If you use inhalers (even only as needed), please bring them with you on the day of your procedure.  Due to recent changes in healthcare laws, you may see the results of your imaging and laboratory studies on MyChart before your provider has had a chance to review them.  We understand that in some cases there may be results that are confusing or concerning to you. Not all laboratory results come back in the same time frame and the provider may be waiting for multiple results in order to interpret others.  Please give [place] 48 hours in order for your provider to thoroughly review all the results before contacting the office for clarification of your results.   Please purchase the following medications over the counter and take as directed:  Take Fiber supplement everyday.  Increase water to 6-8, 8 ounce glasses each day.  We have sent the following medications to your pharmacy for you to pick up at your convenience:  INCREASE: omeprazole to 40mg  one capsule twice daily 30-60 minutes before breakfast and dinner meals.  Thank you for entrusting me with your care and choosing [facility].  Ellouise Newer, [place]

## 2020-07-04 ENCOUNTER — Ambulatory Visit: Payer: 59 | Admitting: Physician Assistant

## 2020-07-06 NOTE — Progress Notes (Signed)
Reviewed and agree with documentation and assessment and plan. K. Veena Keashia Haskins , MD   

## 2020-07-19 ENCOUNTER — Telehealth: Payer: Self-pay

## 2020-07-19 NOTE — Telephone Encounter (Signed)
Patient called requesting an out of work note and stating that meds not working for back pain.

## 2020-07-20 MED ORDER — LEVETIRACETAM 750 MG PO TABS: 1500.0000 mg | ORAL_TABLET | Freq: Two times a day (BID) | ORAL | 5 refills | Status: DC

## 2020-07-20 NOTE — Telephone Encounter (Signed)
  Said pain has been getting worse for last 2-3 weeks.  In Back- now "10/10". Pain says it's so bad, feels like ripping pain.   Pt willing to try to increase Keppra to 1500 mg BID- have to change to 750 mg tab and give 2 tabs 2x/day to work for prescription.  Also needs a note for food stamps saying she's not working, "out of work".   Will write for that tomorrow when in clinic.   Pt's f/u appointment 12/1.

## 2020-07-21 ENCOUNTER — Encounter: Payer: Self-pay | Admitting: Physical Medicine and Rehabilitation

## 2020-07-21 NOTE — Progress Notes (Signed)
Wrote letter for pt since out /disability- not able to work.   Already increased Keppra to 1500 mg BID

## 2020-07-26 ENCOUNTER — Encounter: Payer: Self-pay | Admitting: Gastroenterology

## 2020-07-26 ENCOUNTER — Other Ambulatory Visit: Payer: Self-pay

## 2020-07-26 ENCOUNTER — Ambulatory Visit (AMBULATORY_SURGERY_CENTER): Payer: 59 | Admitting: Gastroenterology

## 2020-07-26 VITALS — BP 127/84 | HR 72 | Temp 98.4°F | Resp 18

## 2020-07-26 DIAGNOSIS — D120 Benign neoplasm of cecum: Secondary | ICD-10-CM | POA: Diagnosis not present

## 2020-07-26 DIAGNOSIS — Z1211 Encounter for screening for malignant neoplasm of colon: Secondary | ICD-10-CM

## 2020-07-26 DIAGNOSIS — G8929 Other chronic pain: Secondary | ICD-10-CM

## 2020-07-26 DIAGNOSIS — K219 Gastro-esophageal reflux disease without esophagitis: Secondary | ICD-10-CM | POA: Diagnosis not present

## 2020-07-26 MED ORDER — SODIUM CHLORIDE 0.9 % IV SOLN: 500.0000 mL | Freq: Once | INTRAVENOUS | Status: AC

## 2020-07-26 NOTE — Op Note (Signed)
[facility] Patient Name: Kimberly Davila Procedure Date: 07/26/2020 2:08 PM MRN: [id number] Endoscopist: Mauri Pole , MD Age: 49 Referring MD:  Date of Birth: 29-May-1971 Gender: Female Account #: [id number] Procedure:                Colonoscopy Indications:              Screening for colorectal malignant neoplasm, This                            is the patient's first colonoscopy Medicines:                Monitored Anesthesia Care Procedure:                Pre-Anesthesia Assessment:                           - Prior to the procedure, a History and Physical                            was performed, and patient medications and                            allergies were reviewed. The patient's tolerance of                            previous anesthesia was also reviewed. The risks                            and benefits of the procedure and the sedation                            options and risks were discussed with the patient.                            All questions were answered, and informed consent                            was obtained. Prior Anticoagulants: The patient has                            taken no previous anticoagulant or antiplatelet                            agents. ASA Grade Assessment: III - A patient with                            severe systemic disease. After reviewing the risks                            and benefits, the patient was deemed in                            satisfactory condition to undergo the procedure.  After obtaining informed consent, the colonoscope                            was passed under direct vision. Throughout the                            procedure, the patient's blood pressure, pulse, and                            oxygen saturations were monitored continuously. The                            Colonoscope was introduced through the anus and                            advanced to the  the cecum, identified by                            appendiceal orifice and ileocecal valve. The                            colonoscopy was performed without difficulty. The                            patient tolerated the procedure well. The quality                            of the bowel preparation was excellent. The                            ileocecal valve, appendiceal orifice, and rectum                            were photographed. Scope In: 2:39:55 PM Scope Out: 2:50:56 PM Scope Withdrawal Time: 0 hours 8 minutes 50 seconds  Total Procedure Duration: 0 hours 11 minutes 1 second  Findings:                 The perianal and digital rectal examinations were                            normal.                           A 12 mm polyp was found in the cecum. The polyp was                            semi-pedunculated. The polyp was removed with a hot                            snare. Resection and retrieval were complete.                           Non-bleeding internal hemorrhoids were found during  retroflexion. The hemorrhoids were small.                           The exam was otherwise without abnormality. Complications:            No immediate complications. Estimated Blood Loss:     Estimated blood loss was minimal. Impression:               - One 12 mm polyp in the cecum, removed with a hot                            snare. Resected and retrieved.                           - Non-bleeding internal hemorrhoids.                           - The examination was otherwise normal. Recommendation:           - Patient has a contact number available for                            emergencies. The signs and symptoms of potential                            delayed complications were discussed with the                            patient. Return to normal activities tomorrow.                            Written discharge instructions were provided to the                             patient.                           - Resume previous diet.                           - Continue present medications.                           - Await pathology results.                           - Repeat colonoscopy in 3 - 5 years for                            surveillance based on pathology results. Mauri Pole, MD 07/26/2020 2:58:54 PM This report has been signed electronically.

## 2020-07-26 NOTE — Progress Notes (Signed)
Called to room to assist during endoscopic procedure.  Patient ID and intended procedure confirmed with present staff. Received instructions for my participation in the procedure from the performing physician.  

## 2020-07-26 NOTE — Patient Instructions (Signed)
Handouts provided on esophagitis, polyps and hemorrhoids.   YOU HAD AN ENDOSCOPIC PROCEDURE TODAY AT [facility]:   Refer to the procedure report that was given to you for any specific questions about what was found during the examination.  If the procedure report does not answer your questions, please call your gastroenterologist to clarify.  If you requested that your care partner not be given the details of your procedure findings, then the procedure report has been included in a sealed envelope for you to review at your convenience later.  YOU SHOULD EXPECT: Some feelings of bloating in the abdomen. Passage of more gas than usual.  Walking can help get rid of the air that was put into your GI tract during the procedure and reduce the bloating. If you had a lower endoscopy (such as a colonoscopy or flexible sigmoidoscopy) you may notice spotting of blood in your stool or on the toilet paper. If you underwent a bowel prep for your procedure, you may not have a normal bowel movement for a few days.  Please Note:  You might notice some irritation and congestion in your nose or some drainage.  This is from the oxygen used during your procedure.  There is no need for concern and it should clear up in a day or so.  SYMPTOMS TO REPORT IMMEDIATELY:   Following lower endoscopy (colonoscopy or flexible sigmoidoscopy):  Excessive amounts of blood in the stool  Significant tenderness or worsening of abdominal pains  Swelling of the abdomen that is new, acute  Fever of 100F or higher   Following upper endoscopy (EGD)  Vomiting of blood or coffee ground material  New chest pain or pain under the shoulder blades  Painful or persistently difficult swallowing  New shortness of breath  Fever of 100F or higher  Black, tarry-looking stools  For urgent or emergent issues, a gastroenterologist can be reached at any hour by calling [phone]. Do not use MyChart messaging for urgent  concerns.    DIET:  We do recommend a small meal at first, but then you may proceed to your regular diet.  Drink plenty of fluids but you should avoid alcoholic beverages for 24 hours.  ACTIVITY:  You should plan to take it easy for the rest of today and you should NOT DRIVE or use heavy machinery until tomorrow (because of the sedation medicines used during the test).    FOLLOW UP: Our staff will call the number listed on your records 48-72 hours following your procedure to check on you and address any questions or concerns that you may have regarding the information given to you following your procedure. If we do not reach you, we will leave a message.  We will attempt to reach you two times.  During this call, we will ask if you have developed any symptoms of COVID 19. If you develop any symptoms (ie: fever, flu-like symptoms, shortness of breath, cough etc.) before then, please call [phone].  If you test positive for Covid 19 in the 2 weeks post procedure, please call and report this information to [place].    If any biopsies were taken you will be contacted by phone or by letter within the next 1-3 weeks.  Please call us at [phone] if you have not heard about the biopsies in 3 weeks.    SIGNATURES/CONFIDENTIALITY: You and/or your care partner have signed paperwork which will be entered into your electronic medical record.  These signatures attest  to the fact that that the information above on your After Visit Summary has been reviewed and is understood.  Full responsibility of the confidentiality of this discharge information lies with you and/or your care-partner.

## 2020-07-26 NOTE — Progress Notes (Signed)
To PACU, VSS. Report to [email or web address] 

## 2020-07-26 NOTE — Progress Notes (Signed)
Vs- CW in ADM  Body peircings removed per instruction of Tabatha Multimedia programmer and ear gages left in as ok'd by T Berry,CRNA

## 2020-07-26 NOTE — Op Note (Signed)
[facility] Patient Name: Kimberly Davila Procedure Date: 07/26/2020 2:09 PM MRN: [id number] Endoscopist: Mauri Pole , MD Age: 49 Referring MD:  Date of Birth: 06-Aug-1971 Gender: Female Account #: [id number] Procedure:                Upper GI endoscopy Indications:              Epigastric abdominal pain, Esophageal reflux                            symptoms that persist despite appropriate therapy Medicines:                Monitored Anesthesia Care Procedure:                Pre-Anesthesia Assessment:                           - Prior to the procedure, a History and Physical                            was performed, and patient medications and                            allergies were reviewed. The patient's tolerance of                            previous anesthesia was also reviewed. The risks                            and benefits of the procedure and the sedation                            options and risks were discussed with the patient.                            All questions were answered, and informed consent                            was obtained. Prior Anticoagulants: The patient has                            taken no previous anticoagulant or antiplatelet                            agents. ASA Grade Assessment: II - A patient with                            mild systemic disease. After reviewing the risks                            and benefits, the patient was deemed in                            satisfactory condition to undergo the procedure.  After obtaining informed consent, the endoscope was                            passed under direct vision. Throughout the                            procedure, the patient's blood pressure, pulse, and                            oxygen saturations were monitored continuously. The                            Endoscope was introduced through the mouth, and                             advanced to the second part of duodenum. The upper                            GI endoscopy was accomplished without difficulty.                            The patient tolerated the procedure well. Scope In: Scope Out: Findings:                 The Z-line was regular and was found 35 cm from the                            incisors.                           The gastroesophageal flap valve was visualized                            endoscopically and classified as Hill Grade II                            (fold present, opens with respiration).                           LA Grade B (one or more mucosal breaks greater than                            5 mm, not extending between the tops of two mucosal                            folds) esophagitis with no bleeding was found 34 to                            35 cm from the incisors.                           The stomach was normal.                           The  examined duodenum was normal. Complications:            No immediate complications. Estimated Blood Loss:     Estimated blood loss: none. Impression:               - Z-line regular, 35 cm from the incisors.                           - Gastroesophageal flap valve classified as Hill                            Grade II (fold present, opens with respiration).                           - LA Grade B reflux esophagitis with no bleeding.                           - Normal stomach.                           - Normal examined duodenum.                           - No specimens collected. Recommendation:           - Patient has a contact number available for                            emergencies. The signs and symptoms of potential                            delayed complications were discussed with the                            patient. Return to normal activities tomorrow.                            Written discharge instructions were provided to the                            patient.                            - Resume previous diet.                           - Continue present medications.                           - Await pathology results.                           - See the other procedure note for documentation of                            additional recommendations. Mauri Pole, MD 07/26/2020 2:56:15 PM This report has been signed electronically.

## 2020-07-28 ENCOUNTER — Telehealth: Payer: Self-pay | Admitting: *Deleted

## 2020-07-28 NOTE — Telephone Encounter (Signed)
  Follow up Call-  Call back number 07/26/2020  Post procedure Call Back phone  # [phone]  Permission to leave phone message Yes  Some recent data might be hidden     Patient questions:  Do you have a fever, pain , or abdominal swelling? No. Pain Score  0 *  Have you tolerated food without any problems? Yes.    Have you been able to return to your normal activities? Yes.    Do you have any questions about your discharge instructions: Diet   No. Medications  No. Follow up visit  No.  Do you have questions or concerns about your Care? No.  Actions: * If pain score is 4 or above: No action needed, pain <4  1. Have you developed a fever since your procedure? NO  2.   Have you had an respiratory symptoms (SOB or cough) since your procedure? NO  3.   Have you tested positive for COVID 19 since your procedure NO  4.   Have you had any family members/close contacts diagnosed with the COVID 19 since your procedure?  NO   If yes to any of these questions please route to Joylene John, RN and Joella Prince, RN

## 2020-08-03 ENCOUNTER — Encounter: Payer: Self-pay | Admitting: Gastroenterology

## 2020-08-16 ENCOUNTER — Encounter: Payer: 59 | Attending: Physical Medicine and Rehabilitation | Admitting: Physical Medicine and Rehabilitation

## 2020-08-16 ENCOUNTER — Encounter: Payer: Self-pay | Admitting: Physical Medicine and Rehabilitation

## 2020-08-16 ENCOUNTER — Other Ambulatory Visit: Payer: Self-pay

## 2020-08-16 VITALS — BP 134/83 | HR 84 | Temp 98.4°F | Ht 65.5 in | Wt 214.0 lb

## 2020-08-16 DIAGNOSIS — M62838 Other muscle spasm: Secondary | ICD-10-CM | POA: Diagnosis present

## 2020-08-16 DIAGNOSIS — M5442 Lumbago with sciatica, left side: Secondary | ICD-10-CM | POA: Diagnosis not present

## 2020-08-16 DIAGNOSIS — G8929 Other chronic pain: Secondary | ICD-10-CM | POA: Diagnosis present

## 2020-08-16 DIAGNOSIS — M5441 Lumbago with sciatica, right side: Secondary | ICD-10-CM | POA: Diagnosis present

## 2020-08-16 DIAGNOSIS — Z79891 Long term (current) use of opiate analgesic: Secondary | ICD-10-CM | POA: Diagnosis present

## 2020-08-16 DIAGNOSIS — G894 Chronic pain syndrome: Secondary | ICD-10-CM | POA: Diagnosis not present

## 2020-08-16 DIAGNOSIS — Z5181 Encounter for therapeutic drug level monitoring: Secondary | ICD-10-CM | POA: Diagnosis present

## 2020-08-16 DIAGNOSIS — M792 Neuralgia and neuritis, unspecified: Secondary | ICD-10-CM | POA: Diagnosis not present

## 2020-08-16 MED ORDER — TRAMADOL HCL 50 MG PO TABS
50.0000 mg | ORAL_TABLET | Freq: Three times a day (TID) | ORAL | 0 refills | Status: DC | PRN
Start: 2020-08-16 — End: 2021-02-19

## 2020-08-16 MED ORDER — MAGNESIUM OXIDE 400 MG PO TABS: 1.0000 | ORAL_TABLET | Freq: Three times a day (TID) | ORAL | 5 refills | Status: AC

## 2020-08-16 NOTE — Patient Instructions (Signed)
Pt is a 49 yr old female with DM A1c of 7.4. diabetic neuropathy,, hypothyroidism- TSH of 5.4, and HTN and tobacco abuse- 1ppd- here for chronic low back pain  1. Trying to get on disability- asking if can fill out paperwork. I agree that pt is NOT able to  work right now due to decreased ROM of back and constant back pain that we haven't been able to get under control- I think pt working any job right now is just not possible- she can barely walk into appointment/do any housework without having to stop due to pain.    2. Will try Keppra/Levicetracam- again- 1000 mg 2x/day.  Don't go up to 1500 mg 2x/day due to constipation.  Has at home. When needs more, call me.   3. With Magnesium Oxide 400 mg - take 2x/day initially- if not enough, increase to 3x/day.    3. Tramadol-  50 mg 2-3x/day as needed for pain. Can take with tylenol 325 mg can make last longer and be stronger. 7 days prescription- call me in 7 days to refill it.   4. Urine drug screen and opiate contract. Today.   5. F/U in 2 months

## 2020-08-16 NOTE — Progress Notes (Signed)
Subjective:    Patient ID: Kimberly Davila, female    DOB: 10-31-1970, 49 y.o.   MRN: [id number]  HPI Pt is a 49 yr old female with DM A1c of 7.4. diabetic neuropathy,, hypothyroidism- TSH of 5.4, and HTN and tobacco abuse- 1ppd- here for chronic low back pain  Increased Keppra at last visit- but made her really constipated.   Has IBS- that goes back and forth- but had constipation for 1 week.  Went 8 days with no BM.    Doesn't sleep well- chronic.      Pain Inventory Average Pain 7 Pain Right Now 7 My pain is constant, sharp, burning and aching  In the last 24 hours, has pain interfered with the following? General activity 7 Relation with others 6 Enjoyment of life 7 What TIME of day is your pain at its worst? morning , daytime, evening and night Sleep (in general) Poor  Pain is worse with: walking, bending, sitting and standing Pain improves with: nothin Relief from Meds: none taken  Family History  Problem Relation Age of Onset  . Diabetes Mother   . Diabetes Father   . Diabetes Maternal Grandmother    Social History   Socioeconomic History  . Marital status: Divorced    Spouse name: Not on file  . Number of children: Not on file  . Years of education: Not on file  . Highest education level: Not on file  Occupational History  . Not on file  Tobacco Use  . Smoking status: Current Every Day Smoker    Packs/day: 1.00    Years: 26.00    Pack years: 26.00    Types: Cigarettes  . Smokeless tobacco: Never Used  Vaping Use  . Vaping Use: Never used  Substance and Sexual Activity  . Alcohol use: Yes  . Drug use: Not Currently  . Sexual activity: Not on file  Other Topics Concern  . Not on file  Social History Narrative  . Not on file   Social Determinants of Health   Financial Resource Strain:   . Difficulty of Paying Living Expenses: Not on file  Food Insecurity:   . Worried About Charity fundraiser in the Last Year: Not on file  . Ran Out of  Food in the Last Year: Not on file  Transportation Needs:   . Lack of Transportation (Medical): Not on file  . Lack of Transportation (Non-Medical): Not on file  Physical Activity:   . Days of Exercise per Week: Not on file  . Minutes of Exercise per Session: Not on file  Stress:   . Feeling of Stress : Not on file  Social Connections:   . Frequency of Communication with Friends and Family: Not on file  . Frequency of Social Gatherings with Friends and Family: Not on file  . Attends Religious Services: Not on file  . Active Member of Clubs or Organizations: Not on file  . Attends Archivist Meetings: Not on file  . Marital Status: Not on file   Past Surgical History:  Procedure Laterality Date  . ABDOMINAL HYSTERECTOMY  2018  . BILATERAL KNEE ARTHROSCOPY    . bilateral shoulder surgery    . CHOLECYSTECTOMY  1991  . NASAL SINUS SURGERY    . piloniday cyst     Past Surgical History:  Procedure Laterality Date  . ABDOMINAL HYSTERECTOMY  2018  . BILATERAL KNEE ARTHROSCOPY    . bilateral shoulder surgery    . CHOLECYSTECTOMY  1991  . NASAL SINUS SURGERY    . piloniday cyst     Past Medical History:  Diagnosis Date  . Allergy   . Asthma   . Diabetes mellitus without complication ([place])   . GERD (gastroesophageal reflux disease)   . Hypertension   . Stroke ([place])   . Thyroid disease    There were no vitals taken for this visit.  Opioid Risk Score:   Fall Risk Score:  `1  Depression screen PHQ 2/9  Depression screen [facility] 2/9 06/21/2020 04/05/2020 02/23/2020  Decreased Interest 0 0 3  Down, Depressed, Hopeless 0 0 1  PHQ - 2 Score 0 0 4  Altered sleeping - - 3  Tired, decreased energy - - 2  Change in appetite - - 1  Feeling bad or failure about yourself  - - 2  Trouble concentrating - - 1  Moving slowly or fidgety/restless - - 0  Suicidal thoughts - - 0  PHQ-9 Score - - 13   Review of Systems  Musculoskeletal: Positive for back pain and gait problem.  All  other systems reviewed and are negative.      Objective:   Physical Exam  Awake, alert, appropriate, NAD Pain with any ROM of spine- TTP over band in low back.  Strength still good in LEs 5/5 B/L Decreased ROM of lumbar spine- cannot flex or extend lumbar spine without pain.      Assessment & Plan:   Pt is a 49 yr old female with DM A1c of 7.4. diabetic neuropathy,, hypothyroidism- TSH of 5.4, and HTN and tobacco abuse- 1ppd- here for chronic low back pain  1. Trying to get on disability- asking if can fill out paperwork. I agree that pt is NOT able to  work right now due to decreased ROM of back and constant back pain that we haven't been able to get under control- I think pt working any job right now is just not possible- she can barely walk into appointment/do any housework without having to stop due to pain.    2. Will try Keppra/Levicetracam- again- 1000 mg 2x/day.  Don't go up to 1500 mg 2x/day due to constipation.  Has at home. When needs more, call me.   3. With Magnesium Oxide 400 mg - take 2x/day initially- if not enough, increase to 3x/day.    3. Tramadol-  50 mg 2-3x/day as needed for pain. Can take with tylenol 325 mg can make last longer and be stronger. 7 days prescription- call me in 7 days to refill it.   4. Urine drug screen and opiate contract. Today.   5. F/U in 2 months  I spent a total of 25 minutes on visit- as detailed above.

## 2020-08-16 NOTE — Addendum Note (Signed)
Addended by: Geryl Rankins D on: 08/16/2020 10:45 AM   Modules accepted: Orders

## 2020-08-16 NOTE — Addendum Note (Signed)
Addended by: Jasmine December T on: 08/16/2020 10:12 AM   Modules accepted: Orders

## 2020-08-19 LAB — DRUG TOX MONITOR 1 W/CONF, ORAL FLD
Amphetamines: NEGATIVE ng/mL (ref <10)
Barbiturates: NEGATIVE ng/mL (ref <10)
Benzodiazepines: NEGATIVE ng/mL (ref <0.50)
Buprenorphine: NEGATIVE ng/mL (ref <0.10)
Cocaine: NEGATIVE ng/mL (ref <5.0)
Cotinine: 40.2 ng/mL — ABNORMAL HIGH (ref <5.0)
Fentanyl: NEGATIVE ng/mL (ref <0.10)
Heroin Metabolite: NEGATIVE ng/mL (ref <1.0)
MARIJUANA: NEGATIVE ng/mL (ref <2.5)
MDMA: NEGATIVE ng/mL (ref <10)
Meprobamate: NEGATIVE ng/mL (ref <2.5)
Methadone: NEGATIVE ng/mL (ref <5.0)
Nicotine Metabolite: POSITIVE ng/mL — AB (ref <5.0)
Opiates: NEGATIVE ng/mL (ref <2.5)
Phencyclidine: NEGATIVE ng/mL (ref <10)
Tapentadol: NEGATIVE ng/mL (ref <5.0)
Tramadol: NEGATIVE ng/mL (ref <5.0)
Zolpidem: NEGATIVE ng/mL (ref <5.0)

## 2020-08-19 LAB — DRUG TOX ALC METAB W/CON, ORAL FLD: Alcohol Metabolite: NEGATIVE ng/mL (ref <25)

## 2020-08-21 ENCOUNTER — Telehealth: Payer: Self-pay | Admitting: *Deleted

## 2020-08-21 NOTE — Telephone Encounter (Signed)
Oral swab drug screen was appropriately negative for prescribed medications.

## 2020-08-25 ENCOUNTER — Telehealth: Payer: Self-pay

## 2020-08-25 NOTE — Telephone Encounter (Signed)
Keeya called back with a medication update. Per patient the Tramadol 50 MG is not working. It is not touching her pain.   Call back ph# [phone].

## 2020-10-18 ENCOUNTER — Encounter: Payer: Self-pay | Admitting: Physical Medicine and Rehabilitation

## 2020-10-18 ENCOUNTER — Other Ambulatory Visit: Payer: Self-pay

## 2020-10-18 ENCOUNTER — Encounter: Payer: 59 | Attending: Physical Medicine and Rehabilitation | Admitting: Physical Medicine and Rehabilitation

## 2020-10-18 VITALS — BP 114/78 | HR 70 | Temp 97.8°F | Ht 65.5 in | Wt 213.4 lb

## 2020-10-18 DIAGNOSIS — G8929 Other chronic pain: Secondary | ICD-10-CM

## 2020-10-18 DIAGNOSIS — M5442 Lumbago with sciatica, left side: Secondary | ICD-10-CM

## 2020-10-18 DIAGNOSIS — M5441 Lumbago with sciatica, right side: Secondary | ICD-10-CM

## 2020-10-18 DIAGNOSIS — M62838 Other muscle spasm: Secondary | ICD-10-CM

## 2020-10-18 DIAGNOSIS — M792 Neuralgia and neuritis, unspecified: Secondary | ICD-10-CM | POA: Diagnosis present

## 2020-10-18 DIAGNOSIS — E039 Hypothyroidism, unspecified: Secondary | ICD-10-CM

## 2020-10-18 MED ORDER — LEVOTHYROXINE SODIUM 75 MCG PO TABS
75.0000 ug | ORAL_TABLET | Freq: Every day | ORAL | 5 refills | Status: DC
Start: 2020-10-18 — End: 2020-10-19

## 2020-10-18 MED ORDER — HYDROCODONE-ACETAMINOPHEN 5-325 MG PO TABS: 1.0000 | ORAL_TABLET | Freq: Three times a day (TID) | ORAL | 0 refills | Status: AC | PRN

## 2020-10-18 NOTE — Patient Instructions (Signed)
Pt is a 50 yr old female with DM A1c of 7.4. diabetic neuropathy,, hypothyroidism- TSH of 5.4, and HTN and tobacco abuse- 1ppd- here for chronic low back pain   1. Will switch to Norco/hydrocodone with tylenol  5/325 mg 3x/day as needed for back pain.   2. Will renew Synthroid 75 mcg daily with 5 refills and recheck TSH to see where she is- we need to keep her TSH ~ 1- and if still elevated, might need to increase thyroid. Take at night on empty stomach- don't eat prior to taking.   3.   Con't Magnesium Oxide and Keppra 1000 mg BID.    4. Back pain appears to be 10-15% less with not smoking. Just FYI.   5. Stop Tramadol. Wasn't helping much.   6. F/U in 6 weeks-

## 2020-10-18 NOTE — Progress Notes (Addendum)
Subjective:    Patient ID: Kimberly Davila, female    DOB: 09/21/1970, 50 y.o.   MRN: [id number]  HPI  Pt is a 50 yr old female with DM A1c of 7.4. diabetic neuropathy,, hypothyroidism- TSH of 5.4, and HTN and tobacco abuse- 1ppd- here for chronic low back pain   Back's killing her.  Tramadol didn't help at all-  Called back for refill- but I didn't get message until today.    Did call back to get refill, but wasn't "helpful at all".   Didn't take the edge off- actually felt worse.   Worse off Keppra for nerve pain.  Is taking 1000 mg 2x/day-  Helping a little, but not a lot.   Taking Mg Oxide 3x/day for bowels- helping the constipation.  Helps REALLY well.    Father has stage III lung cancer.   Pain Inventory Average Pain 8 Pain Right Now 8 My pain is constant, sharp, burning and dull  In the last 24 hours, has pain interfered with the following? General activity 2 Relation with others 2 Enjoyment of life 2 What TIME of day is your pain at its worst? morning , daytime, evening and night Sleep (in general) Poor  Pain is worse with: walking, bending, sitting and standing Pain improves with: medication Relief from Meds: 1  Family History  Problem Relation Age of Onset  . Diabetes Mother   . Diabetes Father   . Diabetes Maternal Grandmother    Social History   Socioeconomic History  . Marital status: Divorced    Spouse name: Not on file  . Number of children: Not on file  . Years of education: Not on file  . Highest education level: Not on file  Occupational History  . Not on file  Tobacco Use  . Smoking status: Current Every Day Smoker    Packs/day: 1.00    Years: 26.00    Pack years: 26.00    Types: Cigarettes  . Smokeless tobacco: Never Used  Vaping Use  . Vaping Use: Never used  Substance and Sexual Activity  . Alcohol use: Yes  . Drug use: Not Currently  . Sexual activity: Not on file  Other Topics Concern  . Not on file  Social History  Narrative  . Not on file   Social Determinants of Health   Financial Resource Strain: Not on file  Food Insecurity: Not on file  Transportation Needs: Not on file  Physical Activity: Not on file  Stress: Not on file  Social Connections: Not on file   Past Surgical History:  Procedure Laterality Date  . ABDOMINAL HYSTERECTOMY  2018  . BILATERAL KNEE ARTHROSCOPY    . bilateral shoulder surgery    . CHOLECYSTECTOMY  1991  . NASAL SINUS SURGERY    . piloniday cyst     Past Surgical History:  Procedure Laterality Date  . ABDOMINAL HYSTERECTOMY  2018  . BILATERAL KNEE ARTHROSCOPY    . bilateral shoulder surgery    . CHOLECYSTECTOMY  1991  . NASAL SINUS SURGERY    . piloniday cyst     Past Medical History:  Diagnosis Date  . Allergy   . Asthma   . Diabetes mellitus without complication ([place])   . GERD (gastroesophageal reflux disease)   . Hypertension   . Stroke ([place])   . Thyroid disease    There were no vitals taken for this visit.  Opioid Risk Score:   Fall Risk Score:  `1  Depression screen  PHQ 2/9  Depression screen [facility] 2/9 08/16/2020 06/21/2020 04/05/2020 02/23/2020  Decreased Interest 0 0 0 3  Down, Depressed, Hopeless 0 0 0 1  PHQ - 2 Score 0 0 0 4  Altered sleeping - - - 3  Tired, decreased energy - - - 2  Change in appetite - - - 1  Feeling bad or failure about yourself  - - - 2  Trouble concentrating - - - 1  Moving slowly or fidgety/restless - - - 0  Suicidal thoughts - - - 0  PHQ-9 Score - - - 13   Review of Systems  Musculoskeletal: Positive for back pain and gait problem.  All other systems reviewed and are negative.      Objective:   Physical Exam  Awake, alert, appropriate, accompanied by daughter, holding self very stiffly in such pain, NAD Strength still 5/5 in LEs Decreased ROM of lumbar spine- cannot lean forward or back more than 10 degrees due to pain.  TTP in band across low back.      Assessment & Plan:   Pt is a 50 yr old female  with DM A1c of 7.4. diabetic neuropathy,, hypothyroidism- TSH of 5.4, and HTN and tobacco abuse- 1ppd- here for chronic low back pain   1. Will switch to Norco/hydrocodone with tylenol  5/325 mg 3x/day as needed for back pain.   2. Will renew Synthroid 75 mcg daily with 5 refills and recheck TSH to see where she is- we need to keep her TSH ~ 1- and if still elevated, might need to increase thyroid. Take at night on empty stomach- don't eat prior to taking.   3.   Con't Magnesium Oxide and Keppra 1000 mg BID.    4. Back pain appears to be 10-15% less with not smoking. Just FYI.   5. Stop Tramadol. Wasn't helping much.   6. F/U in 6 weeks-   I spent a total of 20 minutes on visit- as detailed above   Addendum 10/19/20- pt's TSH is 4.7- and said she's been taking meds "most days"- will increase to 125 mcg daily- since still need TSH ~ 1.0 to be more effective for pain and fatigue- will also send in Norco 5/325 mg TID prn- since is working better for pain.

## 2020-10-19 LAB — TSH: TSH: 4.72 u[IU]/mL — ABNORMAL HIGH (ref 0.450–4.500)

## 2020-10-19 MED ORDER — HYDROCODONE-ACETAMINOPHEN 5-325 MG PO TABS: 1.0000 | ORAL_TABLET | Freq: Four times a day (QID) | ORAL | 0 refills | Status: DC | PRN

## 2020-10-19 MED ORDER — LEVOTHYROXINE SODIUM 125 MCG PO TABS: 125.0000 ug | ORAL_TABLET | Freq: Every day | ORAL | 5 refills | Status: DC

## 2020-10-19 NOTE — Addendum Note (Signed)
Addended by: Courtney Heys on: 10/19/2020 01:00 PM   Modules accepted: Orders

## 2020-11-27 ENCOUNTER — Other Ambulatory Visit: Payer: Self-pay | Admitting: Physical Medicine and Rehabilitation

## 2020-11-27 ENCOUNTER — Telehealth: Payer: Self-pay | Admitting: Physical Medicine and Rehabilitation

## 2020-11-27 MED ORDER — HYDROCODONE-ACETAMINOPHEN 5-325 MG PO TABS: 1.0000 | ORAL_TABLET | Freq: Four times a day (QID) | ORAL | 0 refills | Status: DC | PRN

## 2020-11-27 NOTE — Telephone Encounter (Signed)
Patient has had issues with Insurance, has rescheduled her apt til for 4/6 and insurance should be fixed by then.  Will be out of meds and is requesting a refill.

## 2020-11-29 ENCOUNTER — Encounter: Payer: 59 | Admitting: Physical Medicine and Rehabilitation

## 2020-12-07 ENCOUNTER — Telehealth: Payer: Self-pay

## 2020-12-07 NOTE — Telephone Encounter (Signed)
I cannot put in computer as a referral- can you help me write for it tomorrow when I'm in clinic- and send it back to me!- I'm happy to write for a FCE for her.  Thanks, ML

## 2020-12-07 NOTE — Telephone Encounter (Signed)
Patient requesting a referral to [facility],  in [facility] [place]. Per patient a FCE test is needed. Her lawyer advised her to call you for the referral.   [facility] Fax [phone], Ph [phone].  Jeanine Luz ph# [phone]).

## 2020-12-08 NOTE — Telephone Encounter (Signed)
Notes and order faxed today. Fax confirmation received.

## 2020-12-20 ENCOUNTER — Encounter: Payer: 59 | Attending: Physical Medicine and Rehabilitation | Admitting: Physical Medicine and Rehabilitation

## 2020-12-20 ENCOUNTER — Encounter: Payer: Self-pay | Admitting: Physical Medicine and Rehabilitation

## 2020-12-20 ENCOUNTER — Other Ambulatory Visit: Payer: Self-pay

## 2020-12-20 VITALS — BP 114/72 | HR 85 | Temp 98.3°F | Ht 65.5 in | Wt 212.8 lb

## 2020-12-20 DIAGNOSIS — M5441 Lumbago with sciatica, right side: Secondary | ICD-10-CM | POA: Diagnosis present

## 2020-12-20 DIAGNOSIS — M5442 Lumbago with sciatica, left side: Secondary | ICD-10-CM | POA: Insufficient documentation

## 2020-12-20 DIAGNOSIS — F329 Major depressive disorder, single episode, unspecified: Secondary | ICD-10-CM | POA: Insufficient documentation

## 2020-12-20 DIAGNOSIS — M62838 Other muscle spasm: Secondary | ICD-10-CM | POA: Diagnosis not present

## 2020-12-20 DIAGNOSIS — E039 Hypothyroidism, unspecified: Secondary | ICD-10-CM | POA: Insufficient documentation

## 2020-12-20 DIAGNOSIS — G8929 Other chronic pain: Secondary | ICD-10-CM | POA: Insufficient documentation

## 2020-12-20 MED ORDER — CITALOPRAM HYDROBROMIDE 20 MG PO TABS: 20.0000 mg | ORAL_TABLET | Freq: Every day | ORAL | 5 refills | Status: AC

## 2020-12-20 MED ORDER — HYDROCODONE-ACETAMINOPHEN 5-325 MG PO TABS: 1.0000 | ORAL_TABLET | Freq: Four times a day (QID) | ORAL | 0 refills | Status: DC | PRN

## 2020-12-20 NOTE — Patient Instructions (Signed)
Pt is a 50 yr old female with DM A1c of 7.4. diabetic neuropathy,, hypothyroidism- on Synthroid by me- TSH of 4.7 last time in 2/22, and HTN and tobacco abuse- 1ppd- here for f/u on chronic LBP.    1. Needs Rx for FCE on 12/25/20- will write Rx for pt.   2. Celexa/Citalopram 20 mg daily- can go up in future if need be- energy usually comes first, then mood gets better usually around 7-14 days.   3. Will renew Norco 5/325 mg q6 hours- max 3x/day #90- for pain.   4 Doesn't need refills on Synthroid, but it is to be continued.   5. Could get more constipated from [employer] and Celexa- so remember might need some stool softeners or laxatives. I like Senna-- which you usually get at dollar store.   6.  F/u in 2 months- will check TSH at that time.

## 2020-12-20 NOTE — Progress Notes (Signed)
Subjective:    Patient ID: Kimberly Davila, female    DOB: May 08, 1971, 50 y.o.   MRN: [id number]  HPI Pt is a 50 yr old female with DM A1c of 7.4. diabetic neuropathy,, hypothyroidism- on Synthroid by me- TSH of 4.7 last time in 2/22, and HTN and tobacco abuse- 1ppd- here for f/u on chronic LBP.   Wasn't able to get the [employer] for awhile- so was told Norco was $90-  So hasn't gotten pain meds filled in last month, since beginning of March.     Got Thyroid medication filled And going today to get DM meds- out of meds 1 day.   Needs to get FCE referral to [facility].  Thinks she's really depressed.  Starts crying for no reason-  Has been on meds in past, but been so long, doesn't remember what meds she was on.   Sleeping badly.  Goes to sleep around 1-2am- up by 5:30 am   Pain Inventory Average Pain 9 Pain Right Now 9 My pain is sharp, burning and aching  In the last 24 hours, has pain interfered with the following? General activity 6 Relation with others 6 Enjoyment of life 6 What TIME of day is your pain at its worst? morning , daytime, evening and night Sleep (in general) Poor  Pain is worse with: walking, bending, sitting and standing Pain improves with: no answer Relief from Meds: no answer  Family History  Problem Relation Age of Onset  . Diabetes Mother   . Diabetes Father   . Diabetes Maternal Grandmother    Social History   Socioeconomic History  . Marital status: Divorced    Spouse name: Not on file  . Number of children: Not on file  . Years of education: Not on file  . Highest education level: Not on file  Occupational History  . Not on file  Tobacco Use  . Smoking status: Current Every Day Smoker    Packs/day: 1.00    Years: 26.00    Pack years: 26.00    Types: Cigarettes  . Smokeless tobacco: Never Used  Vaping Use  . Vaping Use: Never used  Substance and Sexual Activity  . Alcohol use: Yes  . Drug use: Not  Currently  . Sexual activity: Not on file  Other Topics Concern  . Not on file  Social History Narrative  . Not on file   Social Determinants of Health   Financial Resource Strain: Not on file  Food Insecurity: Not on file  Transportation Needs: Not on file  Physical Activity: Not on file  Stress: Not on file  Social Connections: Not on file   Past Surgical History:  Procedure Laterality Date  . ABDOMINAL HYSTERECTOMY  2018  . BILATERAL KNEE ARTHROSCOPY    . bilateral shoulder surgery    . CHOLECYSTECTOMY  1991  . NASAL SINUS SURGERY    . piloniday cyst     Past Surgical History:  Procedure Laterality Date  . ABDOMINAL HYSTERECTOMY  2018  . BILATERAL KNEE ARTHROSCOPY    . bilateral shoulder surgery    . CHOLECYSTECTOMY  1991  . NASAL SINUS SURGERY    . piloniday cyst     Past Medical History:  Diagnosis Date  . Allergy   . Asthma   . Diabetes mellitus without complication ([place])   . GERD (gastroesophageal reflux disease)   . Hypertension   . Stroke ([place])   . Thyroid disease    There  were no vitals taken for this visit.  Opioid Risk Score:   Fall Risk Score:  `1  Depression screen PHQ 2/9  Depression screen [facility] 2/9 10/18/2020 08/16/2020 06/21/2020 04/05/2020 02/23/2020  Decreased Interest 0 0 0 0 3  Down, Depressed, Hopeless 0 0 0 0 1  PHQ - 2 Score 0 0 0 0 4  Altered sleeping - - - - 3  Tired, decreased energy - - - - 2  Change in appetite - - - - 1  Feeling bad or failure about yourself  - - - - 2  Trouble concentrating - - - - 1  Moving slowly or fidgety/restless - - - - 0  Suicidal thoughts - - - - 0  PHQ-9 Score - - - - 13   Review of Systems  Constitutional: Negative.   HENT: Negative.   Eyes: Negative.   Respiratory: Negative.   Cardiovascular: Negative.   Gastrointestinal: Negative.   Endocrine: Negative.   Genitourinary: Negative.   Musculoskeletal: Positive for back pain.  Skin: Negative.   Allergic/Immunologic: Negative.   Neurological:  Negative.   Hematological: Negative.   Psychiatric/Behavioral: Positive for dysphoric mood.  All other systems reviewed and are negative.      Objective:   Physical Exam Awake, alert, appropriate, hunched over- and moving gingerly.   Started crying out of nowhere- depressed/flat affect Still TTP in band across low back-     Assessment & Plan:   Pt is a 50 yr old female with DM A1c of 7.4. diabetic neuropathy,, hypothyroidism- on Synthroid by me- TSH of 4.7 last time in 2/22, and HTN and tobacco abuse- 1ppd- here for f/u on chronic LBP.    1. Needs Rx for FCE on 12/25/20- will write Rx for pt.   2. Celexa/Citalopram 20 mg daily- can go up in future if need be- energy usually comes first, then mood gets better usually around 7-14 days.   3. Will renew Norco 5/325 mg q6 hours- max 3x/day #90- for pain.   4 Doesn't need refills on Synthroid, but it is to be continued.   5. Could get more constipated from [place] and Celexa- so remember might need some stool softeners or laxatives. I like Senna-- which you usually get at dollar store.   6.  F/u in 2 months- will check TSH at that time.   I spent a total of 25 minutes on visit- esp going over side effects of Celexa, as well as helping discuss depression overall.

## 2021-02-19 ENCOUNTER — Encounter: Payer: Self-pay | Admitting: Physical Medicine and Rehabilitation

## 2021-02-19 ENCOUNTER — Encounter: Payer: 59 | Attending: Physical Medicine and Rehabilitation | Admitting: Physical Medicine and Rehabilitation

## 2021-02-19 ENCOUNTER — Other Ambulatory Visit: Payer: Self-pay

## 2021-02-19 VITALS — BP 126/84 | HR 72 | Temp 97.9°F | Ht 65.5 in | Wt 213.8 lb

## 2021-02-19 DIAGNOSIS — Z79891 Long term (current) use of opiate analgesic: Secondary | ICD-10-CM | POA: Diagnosis not present

## 2021-02-19 DIAGNOSIS — E039 Hypothyroidism, unspecified: Secondary | ICD-10-CM | POA: Diagnosis not present

## 2021-02-19 DIAGNOSIS — G894 Chronic pain syndrome: Secondary | ICD-10-CM | POA: Insufficient documentation

## 2021-02-19 DIAGNOSIS — Z5181 Encounter for therapeutic drug level monitoring: Secondary | ICD-10-CM | POA: Diagnosis not present

## 2021-02-19 MED ORDER — HYDROCODONE-ACETAMINOPHEN 5-325 MG PO TABS: 1.0000 | ORAL_TABLET | Freq: Four times a day (QID) | ORAL | 0 refills | Status: AC | PRN

## 2021-02-19 MED ORDER — LEVOTHYROXINE SODIUM 125 MCG PO TABS: 125.0000 ug | ORAL_TABLET | Freq: Every day | ORAL | 5 refills | Status: AC
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
## 2021-02-20 LAB — TSH: TSH: 1.6 u[IU]/mL (ref 0.450–4.500)

## 2021-02-24 LAB — TOXASSURE SELECT,+ANTIDEPR,UR

## 2021-02-26 ENCOUNTER — Telehealth: Payer: Self-pay

## 2021-02-26 NOTE — Telephone Encounter (Signed)
Refill request on Hydrocodone/APAP #21 on 02/19/21.

## 2021-02-28 ENCOUNTER — Telehealth: Payer: Self-pay | Admitting: *Deleted

## 2021-02-28 NOTE — Telephone Encounter (Signed)
Urine drug screen is positive for small amt of THC (9 ng/mg). No other controlled medication is present.

## 2021-02-28 NOTE — Telephone Encounter (Signed)
Said we weren't going to do it, because pt said wasn't helpful at last appointment- I'm not sure if we should refill something that didn't help- ML Let me know

## 2021-02-28 NOTE — Telephone Encounter (Signed)
Called patient she states it didn't help. Will call the pharmacy and deny refill.

## 2021-03-12 ENCOUNTER — Encounter: Payer: Self-pay | Admitting: Physical Medicine and Rehabilitation

## 2021-04-30 ENCOUNTER — Other Ambulatory Visit: Payer: Self-pay

## 2021-04-30 ENCOUNTER — Encounter: Payer: Self-pay | Admitting: Physical Medicine and Rehabilitation

## 2021-04-30 ENCOUNTER — Encounter: Payer: 59 | Attending: Physical Medicine and Rehabilitation | Admitting: Physical Medicine and Rehabilitation

## 2021-04-30 VITALS — BP 111/74 | HR 70 | Temp 97.7°F | Ht 65.5 in | Wt 208.6 lb

## 2021-04-30 DIAGNOSIS — M5441 Lumbago with sciatica, right side: Secondary | ICD-10-CM | POA: Diagnosis present

## 2021-04-30 DIAGNOSIS — G894 Chronic pain syndrome: Secondary | ICD-10-CM | POA: Diagnosis not present

## 2021-04-30 DIAGNOSIS — G8929 Other chronic pain: Secondary | ICD-10-CM | POA: Diagnosis present

## 2021-04-30 DIAGNOSIS — M792 Neuralgia and neuritis, unspecified: Secondary | ICD-10-CM | POA: Diagnosis not present

## 2021-04-30 DIAGNOSIS — M62838 Other muscle spasm: Secondary | ICD-10-CM | POA: Insufficient documentation

## 2021-04-30 DIAGNOSIS — M5442 Lumbago with sciatica, left side: Secondary | ICD-10-CM | POA: Diagnosis not present
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
## 2021-07-01 ENCOUNTER — Other Ambulatory Visit: Payer: Self-pay | Admitting: Physical Medicine and Rehabilitation

## 2021-10-30 ENCOUNTER — Other Ambulatory Visit: Payer: Self-pay | Admitting: Family

## 2021-10-30 DIAGNOSIS — R928 Other abnormal and inconclusive findings on diagnostic imaging of breast: Secondary | ICD-10-CM

## 2021-11-16 ENCOUNTER — Emergency Department (HOSPITAL_COMMUNITY): Payer: 59

## 2021-11-16 ENCOUNTER — Other Ambulatory Visit: Payer: Self-pay

## 2021-11-16 ENCOUNTER — Emergency Department (HOSPITAL_COMMUNITY)
Admission: EM | Admit: 2021-11-16 | Discharge: 2021-11-17 | Disposition: A | Discharge status: Home / Self Care | Payer: 59 | Attending: Emergency Medicine | Admitting: Emergency Medicine

## 2021-11-16 ENCOUNTER — Encounter (HOSPITAL_COMMUNITY): Payer: Self-pay | Admitting: *Deleted

## 2021-11-16 DIAGNOSIS — R7989 Other specified abnormal findings of blood chemistry: Secondary | ICD-10-CM | POA: Diagnosis not present

## 2021-11-16 DIAGNOSIS — E871 Hypo-osmolality and hyponatremia: Secondary | ICD-10-CM | POA: Diagnosis not present

## 2021-11-16 DIAGNOSIS — J441 Chronic obstructive pulmonary disease with (acute) exacerbation: Secondary | ICD-10-CM | POA: Diagnosis not present

## 2021-11-16 DIAGNOSIS — E039 Hypothyroidism, unspecified: Secondary | ICD-10-CM | POA: Insufficient documentation

## 2021-11-16 DIAGNOSIS — R739 Hyperglycemia, unspecified: Secondary | ICD-10-CM

## 2021-11-16 DIAGNOSIS — Z7984 Long term (current) use of oral hypoglycemic drugs: Secondary | ICD-10-CM | POA: Diagnosis not present

## 2021-11-16 DIAGNOSIS — D72829 Elevated white blood cell count, unspecified: Secondary | ICD-10-CM | POA: Insufficient documentation

## 2021-11-16 DIAGNOSIS — F17210 Nicotine dependence, cigarettes, uncomplicated: Secondary | ICD-10-CM | POA: Insufficient documentation

## 2021-11-16 DIAGNOSIS — Z79899 Other long term (current) drug therapy: Secondary | ICD-10-CM | POA: Insufficient documentation

## 2021-11-16 DIAGNOSIS — E1165 Type 2 diabetes mellitus with hyperglycemia: Secondary | ICD-10-CM | POA: Diagnosis not present

## 2021-11-16 DIAGNOSIS — R0602 Shortness of breath: Secondary | ICD-10-CM | POA: Diagnosis present

## 2021-11-16 HISTORY — DX: Chronic obstructive pulmonary disease, unspecified: J44.9

## 2021-11-16 LAB — CBC WITH DIFFERENTIAL/PLATELET
Abs Immature Granulocytes: 0.3 10*3/uL — ABNORMAL HIGH (ref 0.00–0.07)
Basophils Absolute: 0 10*3/uL (ref 0.0–0.1)
Basophils Relative: 0 %
Eosinophils Absolute: 0 10*3/uL (ref 0.0–0.5)
Eosinophils Relative: 0 %
HCT: 39.1 % (ref 36.0–46.0)
Hemoglobin: 13.8 g/dL (ref 12.0–15.0)
Immature Granulocytes: 2 %
Lymphocytes Relative: 12 %
Lymphs Abs: 1.6 10*3/uL (ref 0.7–4.0)
MCH: 31.3 pg (ref 26.0–34.0)
MCHC: 35.3 g/dL (ref 30.0–36.0)
MCV: 88.7 fL (ref 80.0–100.0)
Monocytes Absolute: 0.4 10*3/uL (ref 0.1–1.0)
Monocytes Relative: 3 %
Neutro Abs: 10.9 10*3/uL — ABNORMAL HIGH (ref 1.7–7.7)
Neutrophils Relative %: 83 %
Platelets: 219 10*3/uL (ref 150–400)
RBC: 4.41 MIL/uL (ref 3.87–5.11)
RDW: 12.4 % (ref 11.5–15.5)
WBC: 13.3 10*3/uL — ABNORMAL HIGH (ref 4.0–10.5)
nRBC: 0 % (ref 0.0–0.2)

## 2021-11-16 LAB — I-STAT VENOUS BLOOD GAS, ED
Acid-Base Excess: 3 mmol/L — ABNORMAL HIGH (ref 0.0–2.0)
Bicarbonate: 27.1 mmol/L (ref 20.0–28.0)
Calcium, Ion: 1.21 mmol/L (ref 1.15–1.40)
HCT: 40 % (ref 36.0–46.0)
Hemoglobin: 13.6 g/dL (ref 12.0–15.0)
O2 Saturation: 83 %
Patient temperature: 37
Potassium: 3.8 mmol/L (ref 3.5–5.1)
Sodium: 138 mmol/L (ref 135–145)
TCO2: 28 mmol/L (ref 22–32)
pCO2, Ven: 37.8 mmHg — ABNORMAL LOW (ref 44–60)
pH, Ven: 7.463 — ABNORMAL HIGH (ref 7.25–7.43)
pO2, Ven: 44 mmHg (ref 32–45)

## 2021-11-16 LAB — COMPREHENSIVE METABOLIC PANEL
ALT: 39 U/L (ref 0–44)
AST: 29 U/L (ref 15–41)
Albumin: 3.3 g/dL — ABNORMAL LOW (ref 3.5–5.0)
Alkaline Phosphatase: 54 U/L (ref 38–126)
Anion gap: 13 (ref 5–15)
BUN: 29 mg/dL — ABNORMAL HIGH (ref 6–20)
CO2: 21 mmol/L — ABNORMAL LOW (ref 22–32)
Calcium: 9.3 mg/dL (ref 8.9–10.3)
Chloride: 98 mmol/L (ref 98–111)
Creatinine, Ser: 1.03 mg/dL — ABNORMAL HIGH (ref 0.44–1.00)
GFR, Estimated: >60 mL/min (ref >60)
Glucose, Bld: 485 mg/dL — ABNORMAL HIGH (ref 70–99)
Potassium: 4.1 mmol/L (ref 3.5–5.1)
Sodium: 132 mmol/L — ABNORMAL LOW (ref 135–145)
Total Bilirubin: 0.4 mg/dL (ref 0.3–1.2)
Total Protein: 6.7 g/dL (ref 6.5–8.1)

## 2021-11-16 LAB — URINALYSIS, ROUTINE W REFLEX MICROSCOPIC
Bilirubin Urine: NEGATIVE
Glucose, UA: >=500 mg/dL — AB
Hgb urine dipstick: NEGATIVE
Ketones, ur: NEGATIVE mg/dL
Leukocytes,Ua: NEGATIVE
Nitrite: NEGATIVE
Protein, ur: 100 mg/dL — AB
Specific Gravity, Urine: 1.025 (ref 1.005–1.030)
pH: 5 (ref 5.0–8.0)

## 2021-11-16 LAB — CBG MONITORING, ED
Glucose-Capillary: 406 mg/dL — ABNORMAL HIGH (ref 70–99)
Glucose-Capillary: 273 mg/dL — ABNORMAL HIGH (ref 70–99)

## 2021-11-16 MED ORDER — MAGNESIUM SULFATE 2 GM/50ML IV SOLN
2.0000 g | Freq: Once | INTRAVENOUS | Status: AC
Start: 2021-11-16 — End: 2021-11-16
  Administered 2021-11-16: 2 g via INTRAVENOUS
  Filled 2021-11-16: qty 50

## 2021-11-16 MED ORDER — IPRATROPIUM-ALBUTEROL 0.5-2.5 (3) MG/3ML IN SOLN
3.0000 mL | Freq: Once | RESPIRATORY_TRACT | Status: AC
  Administered 2021-11-16: 3 mL via RESPIRATORY_TRACT
  Filled 2021-11-16: qty 3

## 2021-11-16 MED ORDER — INSULIN ASPART 100 UNIT/ML IJ SOLN: 5.0000 [IU] | Freq: Once | INTRAMUSCULAR | Status: DC

## 2021-11-16 MED ORDER — LACTATED RINGERS IV BOLUS
1500.0000 mL | Freq: Once | INTRAVENOUS | Status: AC
  Administered 2021-11-16: 1500 mL via INTRAVENOUS

## 2021-11-16 NOTE — ED Provider Triage Note (Signed)
Emergency Medicine Provider Triage Evaluation Note ? ?Evanie Davila , a 51 y.o. female  was evaluated in triage.  Pt complains of continued SOB.  Discharged from [facility] ED yesterday.  Diagnosed with COPD/asthma during visit.  Sent home with albuterol/neb to use every 6hrs.  It has provided some relief.  Went to PCP today and SPO2 was 80%.  Told to come here.  Hx of DM and HTN. ? ?Review of Systems  ?Positive: As above ?Negative: As above ? ?Physical Exam  ?BP 130/77 (BP Location: Right Arm)   Pulse 93   Temp 97.8 ?F (36.6 ?C) (Oral)   Resp (!) 28   SpO2 96%  ?Gen:   Awake, no distress   ?Resp:  Normal effort, global wheezes bilaterally ?MSK:   Moves extremities without difficulty  ? ?Medical Decision Making  ?Medically screening exam initiated at 3:56 PM.  Appropriate orders placed.  Kimberly Davila was informed that the remainder of the evaluation will be completed by another provider, this initial triage assessment does not replace that evaluation, and the importance of remaining in the ED until their evaluation is complete. ? ?Labs and imaging ordered ?  ?Kimberly Rome, PA-C ?[id number] ? ?

## 2021-11-16 NOTE — ED Triage Notes (Signed)
Pt reports being discharged from [facility] yesterday for copd/asthma. Had increase in sob and spo2 80% at home.  ?

## 2021-11-16 NOTE — ED Provider Notes (Addendum)
[facility] EMERGENCY DEPARTMENT Provider Note   CSN: [id number] Arrival date & time: 11/16/21  1352     History  Chief Complaint  Patient presents with   Shortness of Breath    Kimberly Davila is a 51 y.o. female.  HPI Patient is a 51 year old female with a history of low back pain, acquired hypothyroidism, diabetes mellitus, COPD, who presents to the emergency department due to shortness of breath.  Patient states that she was admitted in [place] for 3 days and discharged yesterday.  She states that she was diagnosed with COPD during this admission.  She states that she was discharged with nebulizers as well as on a prednisone taper.  She states that around 11:30 AM this morning she was walking around her house and became more short of breath.  Reports associated wheezing.  States that she was so short of breath that she was having difficulty forming full sentences.  She measured her pulse ox which was showing 88% to 89% so she came to the emergency department.  Denies any chest pain, nausea, vomiting, abdominal pain, leg swelling.  She states that she has nebulizers at home with mild relief but due to her persistent symptoms came to the emergency department for evaluation.  She states that she smokes 1 pack/day.    Home Medications Prior to Admission medications   Medication Sig Start Date End Date Taking? Authorizing Provider  albuterol (VENTOLIN HFA) 108 (90 Base) MCG/ACT inhaler INHALE 2 PUFFS BY MOUTH 4 TIMES DAILY FOR 5 DAYS THEN AS NEEDED 02/01/20   [provider]  amLODipine (NORVASC) 10 MG tablet Take 10 mg by mouth daily. 02/01/20   [provider]  atorvastatin (LIPITOR) 10 MG tablet Take 10 mg by mouth daily. 02/21/20   [provider]  citalopram (CELEXA) 20 MG tablet Take 1 tablet (20 mg total) by mouth daily. 12/20/20   Lovorn, Jinny Blossom, MD  glyBURIDE (DIABETA) 5 MG tablet Take 5 mg by mouth 2 (two) times daily. 02/21/20   [provider]  hydrochlorothiazide (HYDRODIURIL) 25 MG tablet Take 25 mg by mouth daily. 02/01/20   [provider]  HYDROcodone-acetaminophen (NORCO) 5-325 MG tablet Take 1 tablet by mouth every 6 (six) hours as needed for moderate pain (max 3 tabs/day). 02/19/21   Lovorn, Jinny Blossom, MD  levothyroxine (SYNTHROID) 125 MCG tablet Take 1 tablet (125 mcg total) by mouth daily before breakfast. 02/19/21   Lovorn, Jinny Blossom, MD  losartan (COZAAR) 25 MG tablet Take 25 mg by mouth daily. 02/10/20   [provider]  magnesium oxide (MAG-OX) 400 MG tablet Take 1 tablet (400 mg total) by mouth 3 (three) times daily. 08/16/20   Lovorn, Jinny Blossom, MD  metFORMIN (GLUCOPHAGE) 500 MG tablet Take 1 tablet by mouth 2 (two) times daily. 02/07/20   [provider]  omeprazole (PRILOSEC) 40 MG capsule Take 1 capsule (40 mg total) by mouth 2 (two) times daily before a meal. 06/29/20   Lemmon, Lavone Nian, PA  Vitamin D3 (VITAMIN D) 25 MCG tablet Take 1,000 Units by mouth daily.    [provider]      Allergies    Aspirin and Sulfa antibiotics    Review of Systems   Review of Systems  All other systems reviewed and are negative. Ten systems reviewed and are negative for acute change, except as noted in the HPI.   Physical Exam Updated Vital Signs BP 127/86    Pulse 80    Temp 98.9  F (37.2 C) (Oral)    Resp 18    SpO2 95%  Physical Exam Vitals and nursing note reviewed.  Constitutional:      General: She is not in acute distress.    Appearance: Normal appearance. She is not ill-appearing, toxic-appearing or diaphoretic.     Interventions: She is not intubated. HENT:     Head: Normocephalic and atraumatic.     Right Ear: External ear normal.     Left Ear: External ear normal.     Nose: Nose normal.     Mouth/Throat:     Mouth: Mucous membranes are moist.     Pharynx: Oropharynx is clear. No oropharyngeal exudate or posterior oropharyngeal erythema.  Eyes:     Extraocular Movements:  Extraocular movements intact.  Cardiovascular:     Rate and Rhythm: Normal rate and regular rhythm.     Pulses: Normal pulses.     Heart sounds: Normal heart sounds. No murmur heard.   No friction rub. No gallop.     Comments: RRR without M/R/G. Pulmonary:     Effort: Pulmonary effort is normal. No tachypnea, bradypnea, accessory muscle usage or respiratory distress. She is not intubated.     Breath sounds: No stridor. Examination of the right-lower field reveals wheezing. Examination of the left-lower field reveals wheezing. Wheezing present. No decreased breath sounds, rhonchi or rales.     Comments: Mild expiratory wheezes noted in the bilateral lung bases.  No rales or rhonchi.  Speaking in clear and complete sentences.  Oxygen saturations in the high 90s on room air. Abdominal:     General: Abdomen is flat.     Tenderness: There is no abdominal tenderness.  Musculoskeletal:        General: Normal range of motion.     Cervical back: Normal range of motion and neck supple. No tenderness.     Right lower leg: No edema.     Left lower leg: No edema.     Comments: No pedal edema.  Skin:    General: Skin is warm and dry.  Neurological:     General: No focal deficit present.     Mental Status: She is alert and oriented to person, place, and time.  Psychiatric:        Mood and Affect: Mood normal.        Behavior: Behavior normal.    ED Results / Procedures / Treatments   Labs (all labs ordered are listed, but only abnormal results are displayed) Labs Reviewed  COMPREHENSIVE METABOLIC PANEL - Abnormal; Notable for the following components:      Result Value   Sodium 132 (*)    CO2 21 (*)    Glucose, Bld 485 (*)    BUN 29 (*)    Creatinine, Ser 1.03 (*)    Albumin 3.3 (*)    All other components within normal limits  CBC WITH DIFFERENTIAL/PLATELET - Abnormal; Notable for the following components:   WBC 13.3 (*)    Neutro Abs 10.9 (*)    Abs Immature Granulocytes 0.30 (*)     All other components within normal limits  URINALYSIS, ROUTINE W REFLEX MICROSCOPIC - Abnormal; Notable for the following components:   Glucose, UA >=500 (*)    Protein, ur 100 (*)    Bacteria, UA RARE (*)    All other components within normal limits  CBG MONITORING, ED - Abnormal; Notable for the following components:   Glucose-Capillary 406 (*)    All other  components within normal limits  I-STAT VENOUS BLOOD GAS, ED - Abnormal; Notable for the following components:   pH, Ven 7.463 (*)    pCO2, Ven 37.8 (*)    Acid-Base Excess 3.0 (*)    All other components within normal limits  CBG MONITORING, ED   EKG EKG Interpretation  Date/Time:  Friday November 16 2021 21:14:42 EST Ventricular Rate:  72 PR Interval:  138 QRS Duration: 93 QT Interval:  399 QTC Calculation: 437 R Axis:   39 Text Interpretation: Sinus rhythm Confirmed by Octaviano Glow [phone]) on 11/16/2021 10:36:09 PM  Radiology DG Chest 2 View  Result Date: 11/16/2021 CLINICAL DATA:  Short of breath and weakness and productive cough for a few days. EXAM: CHEST - 2 VIEW COMPARISON:  11/13/2021 that older studies. FINDINGS: Cardiac silhouette is normal in size and configuration. Normal mediastinal and hilar contours. Clear lungs.  No pleural effusion or pneumothorax. Skeletal structures are intact. IMPRESSION: No active cardiopulmonary disease. Electronically Signed   By: Lajean Manes M.D.   On: 11/16/2021 16:29    Procedures Procedures   Medications Ordered in ED Medications  magnesium sulfate IVPB 2 g 50 mL (2 g Intravenous New Bag/Given 11/16/21 2217)  insulin aspart (novoLOG) injection 5 Units (has no administration in time range)  ipratropium-albuterol (DUONEB) 0.5-2.5 (3) MG/3ML nebulizer solution 3 mL (has no administration in time range)  lactated ringers bolus 1,500 mL (1,500 mLs Intravenous New Bag/Given 11/16/21 2216)  ipratropium-albuterol (DUONEB) 0.5-2.5 (3) MG/3ML nebulizer solution 3 mL (3 mLs Nebulization  Given 11/16/21 2218)   ED Course/ Medical Decision Making/ A&P Clinical Course as of 11/17/21 0008  Fri Nov 16, 2021  2137 Elevated blood glucose at 485.  No anion gap.  No ketonuria.  We will check a VBG.  We will give 1.5 L of LR.  Magnesium sulfate.  Patient does note history of diabetes mellitus and was discharged on a prednisone taper from the hospital yesterday.  Likely the cause of her hyperglycemia. [LJ]  2312 Patient reassessed.  Still has a small amount of expiratory wheezes noted in the bilateral lung bases but otherwise lungs are clear to auscultation.  Oxygen saturations around 96% on room air.  Does not appear to be in respiratory distress.  She still endorses some mild shortness of breath and has about 1 L of fluids left, so we will give an additional DuoNeb.  We will also give 5 units of subq insulin.  We will recheck CBG.  Will ambulate with pulse ox after patient has finished her second DuoNeb. [LJ]  2359 Pending walk of life [MK]    Clinical Course User Index [LJ] Rayna Sexton, PA-C [MK] Kommor, Debe Coder, MD                           Medical Decision Making Risk Prescription drug management.  Pt is a 51 y.o. female with a history of COPD who presents to the emergency department due to shortness of breath.  Patient discharged from [facility] yesterday and was admitted for a COPD exacerbation.  She was discharged home with nebulizers, antibiotics, as well as on a prednisone taper.  Labs: CBC with a white count of 13.3, neutrophils of 10.9, absolute immature granulocytes of 0.3. CMP with a sodium of 132, CO2 21, glucose of 45, BUN 29, creatinine of 1.03, albumin of 3.3. UA with 100 protein, greater than 500 glucose, rare bacteria. VBG with a pH of 7.463.  Imaging: Chest x-ray shows no active cardiopulmonary disease.  I, Rayna Sexton, PA-C, personally reviewed and evaluated these images and lab results as part of my medical decision-making.  On my initial exam  patient has mild expiratory wheezes noted in the bilateral lung bases.  Oxygen saturations in the high 90s on room air.  Patient found to be hyperglycemic in triage.  She has a history of diabetes mellitus and is on glyburide as well as metformin which she states she has been compliant with.  She was discharged from the hospital yesterday and started on a prednisone taper for a COPD exacerbation which is likely the cause of her hyperglycemia today.  I obtained a VBG with a pH of 7.463.  No anion gap noted on CMP and no ketonuria noted.  Doubt DKA at this time.  Treated 1.5 L of IV fluids.  Repeat CBG of 273.  Urged patient to discontinue her prednisone use.  Patient's shortness of breath was treated with 2 DuoNebs as well as IV magnesium.  Patient reassessed with significant improvement in her symptoms.  Pulse ox stayed above 90% with ambulation.  Feel that the patient is stable for discharge at this time and she is agreeable.  Urged her to continue with her duo nebs as well as prescribed antibiotics.  Recommended PCP follow-up.  Discussed return precautions in length.  Her questions were answered and she was amicable at the time of discharge.  Note: Portions of this report may have been transcribed using voice recognition software. Every effort was made to ensure accuracy; however, inadvertent computerized transcription errors may be present.   Final Clinical Impression(s) / ED Diagnoses Final diagnoses:  COPD exacerbation ([place])  Hyperglycemia   Rx / DC Orders ED Discharge Orders     None         Rayna Sexton, PA-C 11/16/21 2318    Wyvonnia Dusky, MD 11/16/21 2319    Rayna Sexton, PA-C 11/17/21 0013    Wyvonnia Dusky, MD 11/17/21 [phone]

## 2021-11-17 NOTE — Discharge Instructions (Signed)
Please discontinue your prednisone.  This is likely what caused your high blood sugar today.  Please continue to use your nebulizers as needed.  Please continue to take your prescribed antibiotic.  Please follow-up with your regular doctor as soon as possible for reevaluation.  If you develop any new or worsening symptoms please come back to the emergency department immediately. ?

## 2022-01-09 ENCOUNTER — Ambulatory Visit: Payer: 59 | Admitting: Pulmonary Disease

## 2022-01-09 ENCOUNTER — Encounter: Payer: Self-pay | Admitting: Pulmonary Disease

## 2022-01-09 VITALS — BP 126/74 | HR 75 | Temp 98.1°F | Ht 65.5 in | Wt 212.6 lb

## 2022-01-09 DIAGNOSIS — R0683 Snoring: Secondary | ICD-10-CM | POA: Diagnosis not present

## 2022-01-09 DIAGNOSIS — J449 Chronic obstructive pulmonary disease, unspecified: Secondary | ICD-10-CM | POA: Diagnosis not present

## 2022-01-09 DIAGNOSIS — G4719 Other hypersomnia: Secondary | ICD-10-CM

## 2022-01-09 MED ORDER — ALBUTEROL SULFATE HFA 108 (90 BASE) MCG/ACT IN AERS: 2.0000 | INHALATION_SPRAY | RESPIRATORY_TRACT | 5 refills | Status: AC | PRN

## 2022-01-09 NOTE — Patient Instructions (Addendum)
?  COPD-asthma overlap ?--CONTINUE Breztri TWO puffs TWICE a day. Rinse out mouth after use ?--CONTINUE Albuterol TWO puffs AS NEEDED ?--CONTINUE Albuterol nebulizer AS NEEDED for severe symptoms ?--ORDER pulmonary function tests ?--Encourage regular aerobic activity to increase endurance and stamina ? ?Asthma Action Plan ?Use albuterol worsening shortness of breath, wheezing and cough. If you symptoms do not improve in 24-48 hours, please our office for evaluation and/or prednisone taper. ? ?Snoring ?Witnessed apnea ?Excessive daytime sleepiness ?--ORDER home sleep study ? ?Follow-up with me in 3 months with PFTs prior to visit ?

## 2022-01-09 NOTE — Progress Notes (Signed)
? ? ?Subjective:  ? ?PATIENT ID: Kimberly Davila GENDER: female DOB: [birth year], MRN: [id number] ? ? ?HPI ? ?Chief Complaint  ?Patient presents with  ? Consult  ?  Hx of COPD, SOB   ? ? ?Reason for Visit: New consult for COPD ? ?Ms. Kimberly Davila is a 51 year old female active smoker with COPD, asthma, DM2, HTN, hypothyroidism and hx stroke who presents as a new consult for COPD. ? ?She was recently admitted to [facility] in Feb for COPD exacerbation and reportedly discharged on nebulizers and prednisone taper. Three days after discharge she was seen at [facility] ED for worsening shortness of breath or wheezing with SpO2 ranging 88-89%. Discharged from the ED after Duonebs x 2 and IV mag. Ambulatory O2 >90% in the ED. ? ?She was recently diagnosed with COPD in Feb during her hospitalization. Compliant with Breztri. Has used albuterol twice this month. Before diagnoses she had asthma but never needed hospitalizations. She has quit smoking. At baseline she is sedentary at baseline. She has a pig, chickens, goat that she feeds daily and walking 200 ft can cause her to be short of breath. ? ?She reports poor sleep quality. Will fall asleep when sitting and talking with people. Husband reports she snores and will stop breathing at night and will also awaken gasping as well. ? ?Social History: ?Smoked 1 ppd x 30 years. Quit in March 2023. ? ?I have personally reviewed patient's past medical/family/social history, allergies, current medications. ? ?Past Medical History:  ?Diagnosis Date  ? Allergy   ? Asthma   ? COPD (chronic obstructive pulmonary disease) ([place])   ? Diabetes mellitus without complication ([place])   ? GERD (gastroesophageal reflux disease)   ? Hypertension   ? Stroke [facility])   ? Thyroid disease   ?  ? ?Family History  ?Problem Relation Age of Onset  ? Diabetes Mother   ? Diabetes Father   ? Diabetes Maternal Grandmother   ?  ? ?Social History  ? ?Occupational History  ? Not on file  ?Tobacco Use  ? Smoking  status: Every Day  ?  Packs/day: 1.00  ?  Years: 26.00  ?  Pack years: 26.00  ?  Types: Cigarettes  ? Smokeless tobacco: Never  ?Vaping Use  ? Vaping Use: Never used  ?Substance and Sexual Activity  ? Alcohol use: Yes  ? Drug use: Not Currently  ? Sexual activity: Not on file  ? ? ?Allergies  ?Allergen Reactions  ? Aspirin Anaphylaxis and Swelling  ?  Throat swells ?  ? Sulfa Antibiotics Rash  ?  ? ?Outpatient Medications Prior to Visit  ?Medication Sig Dispense Refill  ? amLODipine (NORVASC) 10 MG tablet Take 10 mg by mouth daily.    ? atorvastatin (LIPITOR) 10 MG tablet Take 10 mg by mouth daily.    ? Budeson-Glycopyrrol-Formoterol (BREZTRI AEROSPHERE) 160-9-4.8 MCG/ACT AERO Inhale 2 puffs into the lungs in the morning and at bedtime.    ? escitalopram (LEXAPRO) 10 MG tablet Take 10 mg by mouth daily.    ? FARXIGA 10 MG TABS tablet Take 10 mg by mouth daily.    ? glyBURIDE (DIABETA) 5 MG tablet Take 5 mg by mouth 2 (two) times daily.    ? hydrochlorothiazide (HYDRODIURIL) 25 MG tablet Take 25 mg by mouth daily.    ? levothyroxine (SYNTHROID) 125 MCG tablet Take 1 tablet (125 mcg total) by mouth daily before breakfast. 30 tablet 5  ? losartan (COZAAR) 25 MG tablet Take  25 mg by mouth daily.    ? magnesium oxide (MAG-OX) 400 MG tablet Take 1 tablet (400 mg total) by mouth 3 (three) times daily. 90 tablet 5  ? metFORMIN (GLUCOPHAGE) 500 MG tablet Take 1 tablet by mouth 2 (two) times daily.    ? pantoprazole (PROTONIX) 40 MG tablet Take 40 mg by mouth daily.    ? Vitamin D3 (VITAMIN D) 25 MCG tablet Take 1,000 Units by mouth daily.    ? albuterol (VENTOLIN HFA) 108 (90 Base) MCG/ACT inhaler INHALE 2 PUFFS BY MOUTH 4 TIMES DAILY FOR 5 DAYS THEN AS NEEDED    ? citalopram (CELEXA) 20 MG tablet Take 1 tablet (20 mg total) by mouth daily. (Patient not taking: Reported on 01/09/2022) 30 tablet 5  ? HYDROcodone-acetaminophen (NORCO) 5-325 MG tablet Take 1 tablet by mouth every 6 (six) hours as needed for moderate pain (max 3  tabs/day). (Patient not taking: Reported on 01/09/2022) 90 tablet 0  ? omeprazole (PRILOSEC) 40 MG capsule Take 1 capsule (40 mg total) by mouth 2 (two) times daily before a meal. (Patient not taking: Reported on 01/09/2022) 60 capsule 3  ? ?Facility-Administered Medications Prior to Visit  ?Medication Dose Route Frequency Provider Last Rate Last Admin  ? 0.9 %  sodium chloride infusion  500 mL Intravenous Once Nandigam, Venia Minks, MD      ? ? ?Review of Systems  ?Constitutional:  Negative for chills, diaphoresis, fever, malaise/fatigue and weight loss.  ?HENT:  Negative for congestion.   ?Respiratory:  Positive for shortness of breath. Negative for cough, hemoptysis, sputum production and wheezing.   ?Cardiovascular:  Negative for chest pain, palpitations and leg swelling.  ? ? ?Objective:  ? ?Vitals:  ? 01/09/22 1018  ?BP: 126/74  ?Pulse: 75  ?Temp: 98.1 ?F (36.7 ?C)  ?TempSrc: Oral  ?SpO2: 97%  ?Weight: 212 lb 9.6 oz (96.4 kg)  ?Height: 5' 5.5" (1.664 m)  ? ?SpO2: 97 % ?O2 Device: None (Room air) ? ?Physical Exam: ?General: Well-appearing, no acute distress ?HENT: [place], AT ?Eyes: EOMI, no scleral icterus ?Respiratory: Clear to auscultation bilaterally.  No crackles, wheezing or rales ?Cardiovascular: RRR, -M/R/G, no JVD ?Extremities:-Edema,-tenderness ?Neuro: AAO x4, CNII-XII grossly intact ?Psych: Normal mood, normal affect ? ?Data Reviewed: ? ?Imaging: ?CXR 11/16/21 - No acute issues ? ?PFT: ?None on file ? ?Labs: ?CBC ?   ?Component Value Date/Time  ? WBC 13.3 (H) 11/16/2021 1605  ? RBC 4.41 11/16/2021 1605  ? HGB 13.6 11/16/2021 2226  ? HCT 40.0 11/16/2021 2226  ? PLT 219 11/16/2021 1605  ? MCV 88.7 11/16/2021 1605  ? MCH 31.3 11/16/2021 1605  ? MCHC 35.3 11/16/2021 1605  ? RDW 12.4 11/16/2021 1605  ? LYMPHSABS 1.6 11/16/2021 1605  ? MONOABS 0.4 11/16/2021 1605  ? EOSABS 0.0 11/16/2021 1605  ? BASOSABS 0.0 11/16/2021 1605  ? ?Absolute eos 11/16/21 - 0 ? ?   ?Assessment & Plan:  ? ?Discussion: ?51 year old female  active smoker with COPD, asthma, DM2, HTN, hypothyroidism and hx stroke who presents as a new consult for COPD. First and last hospitalization in 10/2021. Discussed clinical course and management of COPD/asthma including bronchodilator regimen and action plan for exacerbation. ? ?COPD-asthma overlap ?--CONTINUE Breztri TWO puffs TWICE a day. Rinse out mouth after use ?--CONTINUE Albuterol TWO puffs AS NEEDED ?--CONTINUE Albuterol nebulizer AS NEEDED for severe symptoms ?--ORDER pulmonary function tests ?--Encourage regular aerobic activity to increase endurance and stamina ? ?Asthma Action Plan ?Use albuterol worsening shortness  of breath, wheezing and cough. If you symptoms do not improve in 24-48 hours, please our office for evaluation and/or prednisone taper. ? ?Snoring ?Witnessed apnea ?Excessive daytime sleepiness ?--ORDER home sleep study ? ? ?Health Maintenance ?Immunization History  ?Administered Date(s) Administered  ? Tdap 06/17/2020  ? ?CT Lung Screen - not qualified due to age ? ?No orders of the defined types were placed in this encounter. ? ?Meds ordered this encounter  ?Medications  ? albuterol (VENTOLIN HFA) 108 (90 Base) MCG/ACT inhaler  ?  Sig: Inhale 2 puffs into the lungs every 4 (four) hours as needed for wheezing or shortness of breath.  ?  Dispense:  18 g  ?  Refill:  5  ? ? ?No follow-ups on file. ? ?I have spent a total time of 45-minutes on the day of the appointment reviewing prior documentation, coordinating care and discussing medical diagnosis and plan with the patient/family. Imaging, labs and tests included in this note have been reviewed and interpreted independently by me. ? ?Lilou Kneip Rodman Pickle, MD ?[facility] ?01/09/2022 10:34 AM  ?Office Number [phone] ? ? ?

## 2022-01-09 NOTE — Addendum Note (Signed)
Addended by: Gavin Potters R on: 01/09/2022 11:28 AM ? ? Modules accepted: Orders ? ?

## 2022-04-12 ENCOUNTER — Encounter: Payer: Self-pay | Admitting: Pulmonary Disease

## 2022-04-12 ENCOUNTER — Ambulatory Visit (INDEPENDENT_AMBULATORY_CARE_PROVIDER_SITE_OTHER): Payer: 59 | Admitting: Pulmonary Disease

## 2022-04-12 VITALS — BP 112/72 | HR 76 | Ht 65.5 in | Wt 204.0 lb

## 2022-04-12 DIAGNOSIS — J449 Chronic obstructive pulmonary disease, unspecified: Secondary | ICD-10-CM | POA: Diagnosis not present

## 2022-04-12 DIAGNOSIS — R0683 Snoring: Secondary | ICD-10-CM | POA: Diagnosis not present

## 2022-04-12 MED ORDER — FLUTICASONE-SALMETEROL 230-21 MCG/ACT IN AERO: 2.0000 | INHALATION_SPRAY | Freq: Two times a day (BID) | RESPIRATORY_TRACT | 5 refills | Status: DC

## 2022-04-12 NOTE — Patient Instructions (Signed)
Full PFT performed today. °

## 2022-04-12 NOTE — Progress Notes (Addendum)
Subjective:   PATIENT ID: Kimberly Davila GENDER: female DOB: [birth year], MRN: [id number]   HPI  Chief Complaint  Patient presents with   Follow-up    Reason for Visit: Follow-up  [name] is a 51 year old female active smoker with COPD, asthma, DM2, HTN, hypothyroidism and hx stroke who presents for follow-up.  Initial consult She was recently admitted to [facility] in Feb for COPD exacerbation and reportedly discharged on nebulizers and prednisone taper. Three days after discharge she was seen at [facility] ED for worsening shortness of breath or wheezing with SpO2 ranging 88-89%. Discharged from the ED after Duonebs x 2 and IV mag. Ambulatory O2 >90% in the ED.  She was recently diagnosed with COPD in Feb during her hospitalization. Compliant with Breztri. Has used albuterol twice this month. Before diagnoses she had asthma but never needed hospitalizations. She has quit smoking. At baseline she is sedentary at baseline. She has a pig, chickens, goat that she feeds daily and walking 200 ft can cause her to be short of breath.  She reports poor sleep quality. Will fall asleep when sitting and talking with people. Husband reports she snores and will stop breathing at night and will also awaken gasping as well.  04/12/22 Since her last visit she has not had any exacerbations or ED/hospitalizations.  She reports ACT 17 on Breztri.  Continues to have nocturnal asthma symptoms 1-2 times a week and will use her albuterol inhaler at least twice a week.  Overall improved but still has persistent symptoms of shortness of breath, cough and wheezing.  She also has not had a sleep study ordered yet and continues to have snoring and excessive daytime sleepiness to the point where she will fall asleep while talking to people and has had witnessed apnea by her husband.  Asthma Control Test ACT Total Score  04/12/2022 10:47 AM 17      Social History: Smoked 1 ppd x 30 years. Quit in  March 2023.  I have personally reviewed patient's past medical/family/social history, allergies, current medications.  Past Medical History:  Diagnosis Date   Allergy    Asthma    COPD (chronic obstructive pulmonary disease) ([place])    Diabetes mellitus without complication (HCC)    GERD (gastroesophageal reflux disease)    Hypertension    Stroke ([place])    Thyroid disease      Family History  Problem Relation Age of Onset   Diabetes Mother    Diabetes Father    Diabetes Maternal Grandmother      Social History   Occupational History   Not on file  Tobacco Use   Smoking status: Every Day    Packs/day: 1.00    Years: 26.00    Total pack years: 26.00    Types: Cigarettes   Smokeless tobacco: Never   Tobacco comments:    1/2 ppd 04/12/22  Vaping Use   Vaping Use: Never used  Substance and Sexual Activity   Alcohol use: Yes   Drug use: Not Currently   Sexual activity: Not on file    Allergies  Allergen Reactions   Aspirin Anaphylaxis and Swelling    Throat swells    Sulfa Antibiotics Rash     Outpatient Medications Prior to Visit  Medication Sig Dispense Refill   albuterol (VENTOLIN HFA) 108 (90 Base) MCG/ACT inhaler Inhale 2 puffs into the lungs every 4 (four) hours as needed for wheezing or shortness of breath. 18 g 5  amLODipine (NORVASC) 10 MG tablet Take 10 mg by mouth daily.     atorvastatin (LIPITOR) 10 MG tablet Take 10 mg by mouth daily.     escitalopram (LEXAPRO) 10 MG tablet Take 10 mg by mouth daily.     FARXIGA 10 MG TABS tablet Take 10 mg by mouth daily.     glyBURIDE (DIABETA) 5 MG tablet Take 5 mg by mouth 2 (two) times daily.     hydrochlorothiazide (HYDRODIURIL) 25 MG tablet Take 25 mg by mouth daily.     levothyroxine (SYNTHROID) 125 MCG tablet Take 1 tablet (125 mcg total) by mouth daily before breakfast. 30 tablet 5   losartan (COZAAR) 25 MG tablet Take 25 mg by mouth daily.     magnesium oxide (MAG-OX) 400 MG tablet Take 1 tablet (400 mg  total) by mouth 3 (three) times daily. 90 tablet 5   metFORMIN (GLUCOPHAGE) 500 MG tablet Take 1 tablet by mouth 2 (two) times daily.     pantoprazole (PROTONIX) 40 MG tablet Take 40 mg by mouth daily.     Vitamin D3 (VITAMIN D) 25 MCG tablet Take 1,000 Units by mouth daily.     Budeson-Glycopyrrol-Formoterol (BREZTRI AEROSPHERE) 160-9-4.8 MCG/ACT AERO Inhale 2 puffs into the lungs in the morning and at bedtime.     citalopram (CELEXA) 20 MG tablet Take 1 tablet (20 mg total) by mouth daily. (Patient not taking: Reported on 01/09/2022) 30 tablet 5   HYDROcodone-acetaminophen (NORCO) 5-325 MG tablet Take 1 tablet by mouth every 6 (six) hours as needed for moderate pain (max 3 tabs/day). (Patient not taking: Reported on 01/09/2022) 90 tablet 0   omeprazole (PRILOSEC) 40 MG capsule Take 1 capsule (40 mg total) by mouth 2 (two) times daily before a meal. (Patient not taking: Reported on 01/09/2022) 60 capsule 3   Facility-Administered Medications Prior to Visit  Medication Dose Route Frequency Provider Last Rate Last Admin   0.9 %  sodium chloride infusion  500 mL Intravenous Once Nandigam, Kavitha V, MD        Review of Systems  Constitutional:  Negative for chills, diaphoresis, fever, malaise/fatigue and weight loss.  HENT:  Negative for congestion.   Respiratory:  Positive for cough, shortness of breath and wheezing. Negative for hemoptysis and sputum production.   Cardiovascular:  Negative for chest pain, palpitations and leg swelling.     Objective:   Vitals:   04/12/22 1018  BP: 112/72  Pulse: 76  SpO2: 97%  Weight: 204 lb (92.5 kg)  Height: 5' 5.5" (1.664 m)   SpO2: 97 % O2 Device: None (Room air)  Physical Exam: General: Well-appearing, no acute distress HENT: [place], AT Eyes: EOMI, no scleral icterus Respiratory: Clear to auscultation bilaterally.  No crackles, wheezing or rales Cardiovascular: RRR, -M/R/G, no JVD Extremities:-Edema,-tenderness Neuro: AAO x4, CNII-XII grossly  intact Psych: Normal mood, normal affect  Data Reviewed:  Imaging: CXR 11/16/21 - No acute issues  PFT: 04/12/22 FVC 3.64 (97%) FEV1 2.57 (58%) Ratio 58 TLC 116% DLCO 83% Interpretation: Mild severe obstructive defect with air trapping. Significant bronchodilator response.   Labs: CBC    Component Value Date/Time   WBC 13.3 (H) 11/16/2021 1605   RBC 4.41 11/16/2021 1605   HGB 13.6 11/16/2021 2226   HCT 40.0 11/16/2021 2226   PLT 219 11/16/2021 1605   MCV 88.7 11/16/2021 1605   MCH 31.3 11/16/2021 1605   MCHC 35.3 11/16/2021 1605   RDW 12.4 11/16/2021 1605   LYMPHSABS 1.6 11/16/2021 1605  MONOABS 0.4 11/16/2021 1605   EOSABS 0.0 11/16/2021 1605   BASOSABS 0.0 11/16/2021 1605   Absolute eos 11/16/21 - 0     Assessment & Plan:   Discussion: 51 year old female active smoker with COPD, asthma, diabetes, hypertension, hypothyroidism and history of stroke who presents for follow-up PFTs.  Significant bronchodilator response in setting of moderately severe obstructive defect.  Continues to have symptoms despite triple therapy.  First and last hospitalization in 10/2021.  We will plan to increase ICS strength given her hyperreactive airways. Discussed clinical course and management of asthma including bronchodilator regimen and action plan for exacerbation.  COPD-asthma overlap --STOP Breztri --START Advair 230-21 mcg TWO puffs TWICE a day. Rinse out mouth after use --CONTINUE Albuterol TWO puffs AS NEEDED --CONTINUE Albuterol nebulizer AS NEEDED for severe symptoms --Encourage regular aerobic activity to increase endurance and stamina  Asthma Action Plan Use albuterol worsening shortness of breath, wheezing and cough. If you symptoms do not improve in 24-48 hours, please our office for evaluation and/or prednisone taper.  Snoring Witnessed apnea Excessive daytime sleepiness --ORDER home sleep study  Tobacco abuse Discuss at next visit  Health Maintenance Immunization  History  Administered Date(s) Administered   Tdap 06/17/2020   CT Lung Screen - not qualified due to age  Orders Placed This Encounter  Procedures   Home sleep test    Standing Status:   Future    Standing Expiration Date:   04/13/2023    Scheduling Instructions:     Home sleep test    Order Specific Question:   Where should this test be performed:    Answer:   Other   Meds ordered this encounter  Medications   fluticasone-salmeterol (ADVAIR HFA) 230-21 MCG/ACT inhaler    Sig: Inhale 2 puffs into the lungs 2 (two) times daily.    Dispense:  1 each    Refill:  5    Return in about 3 months (around 07/13/2022).  I have spent a total time of 35-minutes on the day of the appointment reviewing prior documentation, coordinating care and discussing medical diagnosis and plan with the patient/family. Past medical history, allergies, medications were reviewed. Pertinent imaging, labs and tests included in this note have been reviewed and interpreted independently by me.  Isabela, MD [facility] 04/12/2022  Office Number [phone]

## 2022-04-12 NOTE — Progress Notes (Signed)
Full PFT performed today. °

## 2022-04-12 NOTE — Patient Instructions (Signed)
COPD-asthma overlap --STOP Breztri --START Advair 230-21 mcg TWO puffs TWICE a day. Rinse out mouth after use --CONTINUE Albuterol TWO puffs AS NEEDED --CONTINUE Albuterol nebulizer AS NEEDED for severe symptoms --Encourage regular aerobic activity to increase endurance and stamina  Asthma Action Plan Use albuterol worsening shortness of breath, wheezing and cough. If you symptoms do not improve in 24-48 hours, please our office for evaluation and/or prednisone taper.  Snoring Witnessed apnea Excessive daytime sleepiness --ORDER home sleep study  Follow-up with me in 3 months

## 2022-04-18 ENCOUNTER — Ambulatory Visit: Payer: Commercial Managed Care - HMO

## 2022-04-18 DIAGNOSIS — G4733 Obstructive sleep apnea (adult) (pediatric): Secondary | ICD-10-CM

## 2022-04-18 DIAGNOSIS — R0683 Snoring: Secondary | ICD-10-CM

## 2022-04-19 LAB — PULMONARY FUNCTION TEST
DL/VA % pred: 79 %
DL/VA: 3.38 ml/min/mmHg/L
DLCO cor % pred: 83 %
DLCO cor: 18.66 ml/min/mmHg
DLCO unc % pred: 83 %
DLCO unc: 18.66 ml/min/mmHg
FEF 25-75 Post: 2.76 L/sec
FEF 25-75 Pre: 0.95 L/sec
FEF2575-%Change-Post: 190 %
FEF2575-%Pred-Post: 95 %
FEF2575-%Pred-Pre: 32 %
FEV1-%Change-Post: 64 %
FEV1-%Pred-Post: 86 %
FEV1-%Pred-Pre: 52 %
FEV1-Post: 2.57 L
FEV1-Pre: 1.56 L
FEV1FVC-%Change-Post: 21 %
FEV1FVC-%Pred-Pre: 72 %
FEV6-%Change-Post: 28 %
FEV6-%Pred-Post: 95 %
FEV6-%Pred-Pre: 73 %
FEV6-Post: 3.47 L
FEV6-Pre: 2.69 L
FEV6FVC-%Pred-Post: 102 %
FEV6FVC-%Pred-Pre: 102 %
FVC-%Change-Post: 35 %
FVC-%Pred-Post: 97 %
FVC-%Pred-Pre: 71 %
FVC-Post: 3.64 L
FVC-Pre: 2.69 L
Post FEV1/FVC ratio: 71 %
Post FEV6/FVC ratio: 100 %
Pre FEV1/FVC ratio: 58 %
Pre FEV6/FVC Ratio: 100 %
RV % pred: 145 %
RV: 2.68 L
TLC % pred: 116 %
TLC: 6.14 L

## 2022-04-25 DIAGNOSIS — G4733 Obstructive sleep apnea (adult) (pediatric): Secondary | ICD-10-CM | POA: Diagnosis not present

## 2022-05-06 ENCOUNTER — Telehealth: Payer: Self-pay | Admitting: Pulmonary Disease

## 2022-05-06 DIAGNOSIS — G4733 Obstructive sleep apnea (adult) (pediatric): Secondary | ICD-10-CM

## 2022-05-06 NOTE — Telephone Encounter (Signed)
[facility] Results  HST 04/18/22 - Mild OSA with AHI 5.3 and nadir SpO2 84%  Assessment/Plan Mild OSA Based on clinical symptoms of apnea and poor sleep, recommend CPAP --Contact patient regarding sleep study results and if patient agreeable please order CPAP supplies (autoPAP 5-15 cm H20). If not, we can discuss alternative options at next visit --Keep follow-up scheduled in October with me  Rodman Pickle, M.D. [facility] 05/06/2022 7:25 PM

## 2022-06-18 ENCOUNTER — Encounter: Payer: Self-pay | Admitting: Pulmonary Disease

## 2022-06-18 ENCOUNTER — Ambulatory Visit (INDEPENDENT_AMBULATORY_CARE_PROVIDER_SITE_OTHER): Payer: Commercial Managed Care - HMO | Admitting: Pulmonary Disease

## 2022-06-18 VITALS — BP 112/70 | HR 73 | Ht 65.5 in | Wt 212.4 lb

## 2022-06-18 DIAGNOSIS — G4733 Obstructive sleep apnea (adult) (pediatric): Secondary | ICD-10-CM | POA: Diagnosis not present

## 2022-06-18 DIAGNOSIS — J4489 Other specified chronic obstructive pulmonary disease: Secondary | ICD-10-CM

## 2022-06-18 DIAGNOSIS — Z720 Tobacco use: Secondary | ICD-10-CM

## 2022-06-18 MED ORDER — FLUTICASONE-SALMETEROL 230-21 MCG/ACT IN AERO: 2.0000 | INHALATION_SPRAY | Freq: Two times a day (BID) | RESPIRATORY_TRACT | 5 refills | Status: AC

## 2022-06-18 NOTE — Progress Notes (Unsigned)
Subjective:   PATIENT ID: Kimberly Davila GENDER: female DOB: October 14, 1970, MRN: [id number]   HPI  Chief Complaint  Patient presents with   Follow-up    Cpap compliance    Reason for Visit: Follow-up  Ms. Kimberly Davila is a 51 year old female active smoker with COPD, asthma, DM2, HTN, hypothyroidism and hx stroke who presents for follow-up.  Synopsis She was recently diagnosed with COPD in Feb 2023 during her hospitalization. Compliant with Breztri. Has used albuterol twice this month. Before diagnoses she had asthma but never needed hospitalizations. She has quit smoking. At baseline she is sedentary at baseline. She has a pig, chickens, goat that she feeds daily and walking 200 ft can cause her to be short of breath. Diagnosed with mild OSA in Aug 2023 after reporting poor sleep quality, falling asleep when sitting and talking with people, snoring and witness apnea/gasping.  06/18/22 Since our last visit she reports her quality of sleep has improved on CPAP. No longer gasping or choking at night. Energy is about the same. She still has shortness of breath with activity including walking in [place]. She is compliant with Advair.  Denies coughing or wheezing. Not currently exercising regularly. She is continuing to smoke 1/2 ppd. Did quit briefly but restarted bc husband smokes. Unable to use nicotine patches.  Asthma Control Test ACT Total Score  04/12/2022 10:47 AM 17   Social History: Smoked 1 ppd x 30 years. Quit in March 2023.  Past Medical History:  Diagnosis Date   Allergy    Asthma    COPD (chronic obstructive pulmonary disease) ([place])    Diabetes mellitus without complication (HCC)    GERD (gastroesophageal reflux disease)    Hypertension    Stroke ([place])    Thyroid disease      Family History  Problem Relation Age of Onset   Diabetes Mother    Diabetes Father    Diabetes Maternal Grandmother      Social History   Occupational History   Not on file   Tobacco Use   Smoking status: Every Day    Packs/day: 1.00    Years: 26.00    Total pack years: 26.00    Types: Cigarettes   Smokeless tobacco: Never   Tobacco comments:    1/2 ppd 04/12/22  Vaping Use   Vaping Use: Never used  Substance and Sexual Activity   Alcohol use: Yes   Drug use: Not Currently   Sexual activity: Not on file    Allergies  Allergen Reactions   Aspirin Anaphylaxis and Swelling    Throat swells    Sulfa Antibiotics Rash     Outpatient Medications Prior to Visit  Medication Sig Dispense Refill   amLODipine (NORVASC) 10 MG tablet Take 10 mg by mouth daily.     atorvastatin (LIPITOR) 10 MG tablet Take 10 mg by mouth daily.     FARXIGA 10 MG TABS tablet Take 10 mg by mouth daily.     glyBURIDE (DIABETA) 5 MG tablet Take 5 mg by mouth 2 (two) times daily.     hydrochlorothiazide (HYDRODIURIL) 25 MG tablet Take 25 mg by mouth daily.     levothyroxine (SYNTHROID) 125 MCG tablet Take 1 tablet (125 mcg total) by mouth daily before breakfast. 30 tablet 5   losartan (COZAAR) 25 MG tablet Take 25 mg by mouth daily.     magnesium oxide (MAG-OX) 400 MG tablet Take 1 tablet (400 mg total) by mouth 3 (three) times  daily. 90 tablet 5   metFORMIN (GLUCOPHAGE) 500 MG tablet Take 1 tablet by mouth 2 (two) times daily.     omeprazole (PRILOSEC) 40 MG capsule Take 1 capsule (40 mg total) by mouth 2 (two) times daily before a meal. 60 capsule 3   pantoprazole (PROTONIX) 40 MG tablet Take 40 mg by mouth daily.     Vitamin D3 (VITAMIN D) 25 MCG tablet Take 1,000 Units by mouth daily.     fluticasone-salmeterol (ADVAIR HFA) 230-21 MCG/ACT inhaler Inhale 2 puffs into the lungs 2 (two) times daily. 1 each 5   albuterol (VENTOLIN HFA) 108 (90 Base) MCG/ACT inhaler Inhale 2 puffs into the lungs every 4 (four) hours as needed for wheezing or shortness of breath. 18 g 5   citalopram (CELEXA) 20 MG tablet Take 1 tablet (20 mg total) by mouth daily. (Patient not taking: Reported on  01/09/2022) 30 tablet 5   escitalopram (LEXAPRO) 10 MG tablet Take 10 mg by mouth daily.     HYDROcodone-acetaminophen (NORCO) 5-325 MG tablet Take 1 tablet by mouth every 6 (six) hours as needed for moderate pain (max 3 tabs/day). (Patient not taking: Reported on 01/09/2022) 90 tablet 0   Facility-Administered Medications Prior to Visit  Medication Dose Route Frequency Provider Last Rate Last Admin   0.9 %  sodium chloride infusion  500 mL Intravenous Once Nandigam, Kavitha V, MD        Review of Systems  Constitutional:  Negative for chills, diaphoresis, fever, malaise/fatigue and weight loss.  HENT:  Negative for congestion.   Respiratory:  Positive for shortness of breath. Negative for cough, hemoptysis, sputum production and wheezing.   Cardiovascular:  Negative for chest pain, palpitations and leg swelling.     Objective:   Vitals:   06/18/22 1056  BP: 112/70  Pulse: 73  SpO2: 96%  Weight: 212 lb 6.4 oz (96.3 kg)  Height: 5' 5.5" (1.664 m)   SpO2: 96 % O2 Device: None (Room air)  Physical Exam: General: Well-appearing, no acute distress HENT: [place], AT Eyes: EOMI, no scleral icterus Respiratory: Clear to auscultation bilaterally.  No crackles, wheezing or rales Cardiovascular: RRR, -M/R/G, no JVD Extremities:-Edema,-tenderness Neuro: AAO x4, CNII-XII grossly intact Psych: Normal mood, normal affect  Data Reviewed:  Imaging: CXR 11/16/21 - No acute issues  PFT: 04/12/22 FVC 3.64 (97%) FEV1 2.57 (58%) Ratio 58 TLC 116% DLCO 83% Interpretation: Moderately severe obstructive defect with air trapping. Significant bronchodilator response.  Sleep: HST 04/18/22 - Mild OSA with AHI 5.3 and nadir SpO2 84%  CPAP compliance 05/19/22-06/17/22 Usage days 25/30 days (83%) >4 hours 23 days (77%) AHI 1.3 AutoCPAP 5-15 cm H20  Labs: CBC    Component Value Date/Time   WBC 13.3 (H) 11/16/2021 1605   RBC 4.41 11/16/2021 1605   HGB 13.6 11/16/2021 2226   HCT 40.0 11/16/2021 2226    PLT 219 11/16/2021 1605   MCV 88.7 11/16/2021 1605   MCH 31.3 11/16/2021 1605   MCHC 35.3 11/16/2021 1605   RDW 12.4 11/16/2021 1605   LYMPHSABS 1.6 11/16/2021 1605   MONOABS 0.4 11/16/2021 1605   EOSABS 0.0 11/16/2021 1605   BASOSABS 0.0 11/16/2021 1605   Absolute eos 11/16/21 - 0     Assessment & Plan:   Discussion: 51 year old female active smoker with COPD-asthma overlap, DM2, HTN, hypothyroidism and hx stroke who presents for follow-up. Improved symptoms except for shortness of breath on increased ICS strength. Improved sleep quality after starting CPAP. Discussed clinical  course and management of COPD/asthma including bronchodilator regimen and action plan for exacerbation. Currently symptoms likely related to deconditioning at this point.  COPD-asthma overlap --CONTINUE Advair 230-21 mcg TWO puffs TWICE a day. Rinse out mouth after use --CONTINUE Albuterol TWO puffs AS NEEDED --CONTINUE Albuterol nebulizer AS NEEDED for severe symptoms --Encourage regular aerobic activity to increase endurance and stamina  Asthma Action Plan Use albuterol worsening shortness of breath, wheezing and cough. If you symptoms do not improve in 24-48 hours, please our office for evaluation and/or prednisone taper.  Mild OSA The natural history, progression and prognosis of sleep apnea, treatment with PAP and alternative treatment strategies were discussed. The patient was also educated regarding the long term cardiovascular benefits of treating sleep apnea, including improved blood pressure control, reduction in MI and stroke risk as well as other potential benefits of treatment, such as improved glycemic control, facilitation of weight loss, improved energy during the day and improved sleep quality. --Reviewed compliance report. --Patient uses NIV for more than four hours nightly for at least 70% of nights during the last three months of usage. The patient has been using and benefiting from PAP use  and will continue to benefit from therapy.  --Counseled on sleep hygiene --Counseled on weight loss/maintenance of healthy weight --Counseled NOT to drive if/when sleepy --Advised patient to wear CPAP for at least 4 hours each night for greater than 70% of the time to avoid the machine being repossessed by insurance.  Tobacco abuse Counseled smoking cessation Encouraged cutting down to 1/4 ppd by Christmas   Health Maintenance Immunization History  Administered Date(s) Administered   Tdap 06/17/2020   CT Lung Screen - not qualified due to age  No orders of the defined types were placed in this encounter.  Meds ordered this encounter  Medications   fluticasone-salmeterol (ADVAIR HFA) 230-21 MCG/ACT inhaler    Sig: Inhale 2 puffs into the lungs 2 (two) times daily.    Dispense:  1 each    Refill:  5    Return in about 6 months (around 12/18/2022).  I have spent a total time of 35-minutes on the day of the appointment including chart review, data review, collecting history, coordinating care and discussing medical diagnosis and plan with the patient/family. Past medical history, allergies, medications were reviewed. Pertinent imaging, labs and tests included in this note have been reviewed and interpreted independently by me.  Johnwesley Lederman Rodman Pickle, MD [facility] Office Number [phone]

## 2022-06-18 NOTE — Patient Instructions (Signed)
COPD-asthma overlap --CONTINUE Advair 230-21 mcg TWO puffs TWICE a day. Rinse out mouth after use --CONTINUE Albuterol TWO puffs AS NEEDED --CONTINUE Albuterol nebulizer AS NEEDED for severe symptoms --Encourage regular aerobic activity to increase endurance and stamina  Asthma Action Plan Use albuterol worsening shortness of breath, wheezing and cough. If you symptoms do not improve in 24-48 hours, please our office for evaluation and/or prednisone taper.  Mild OSA The natural history, progression and prognosis of sleep apnea, treatment with PAP and alternative treatment strategies were discussed. The patient was also educated regarding the long term cardiovascular benefits of treating sleep apnea, including improved blood pressure control, reduction in MI and stroke risk as well as other potential benefits of treatment, such as improved glycemic control, facilitation of weight loss, improved energy during the day and improved sleep quality. --Patient uses NIV for more than four hours nightly for at least 70% of nights during the last three months of usage. The patient has been using and benefiting from PAP use and will continue to benefit from therapy.  --Counseled on sleep hygiene --Counseled on weight loss/maintenance of healthy weight --Counseled NOT to drive if/when sleepy --Advised patient to wear CPAP for at least 4 hours each night for greater than 70% of the time to avoid the machine being repossessed by insurance.  Tobacco abuse Counseled smoking cessation Encouraged cutting down to 1/4 ppd by Christmas   Follow-up with me in 6 months

## 2022-12-10 ENCOUNTER — Ambulatory Visit (HOSPITAL_BASED_OUTPATIENT_CLINIC_OR_DEPARTMENT_OTHER): Payer: Commercial Managed Care - HMO | Admitting: Pulmonary Disease

## 2023-07-19 IMAGING — CR DG CHEST 2V
2 series · 2 of 2 positions shown · non-contrast
Comparison: 11/13/2021 that older studies.

CLINICAL DATA: Short of breath and weakness and productive cough
for a few days.

EXAM:
CHEST - 2 VIEW

[chest pa]
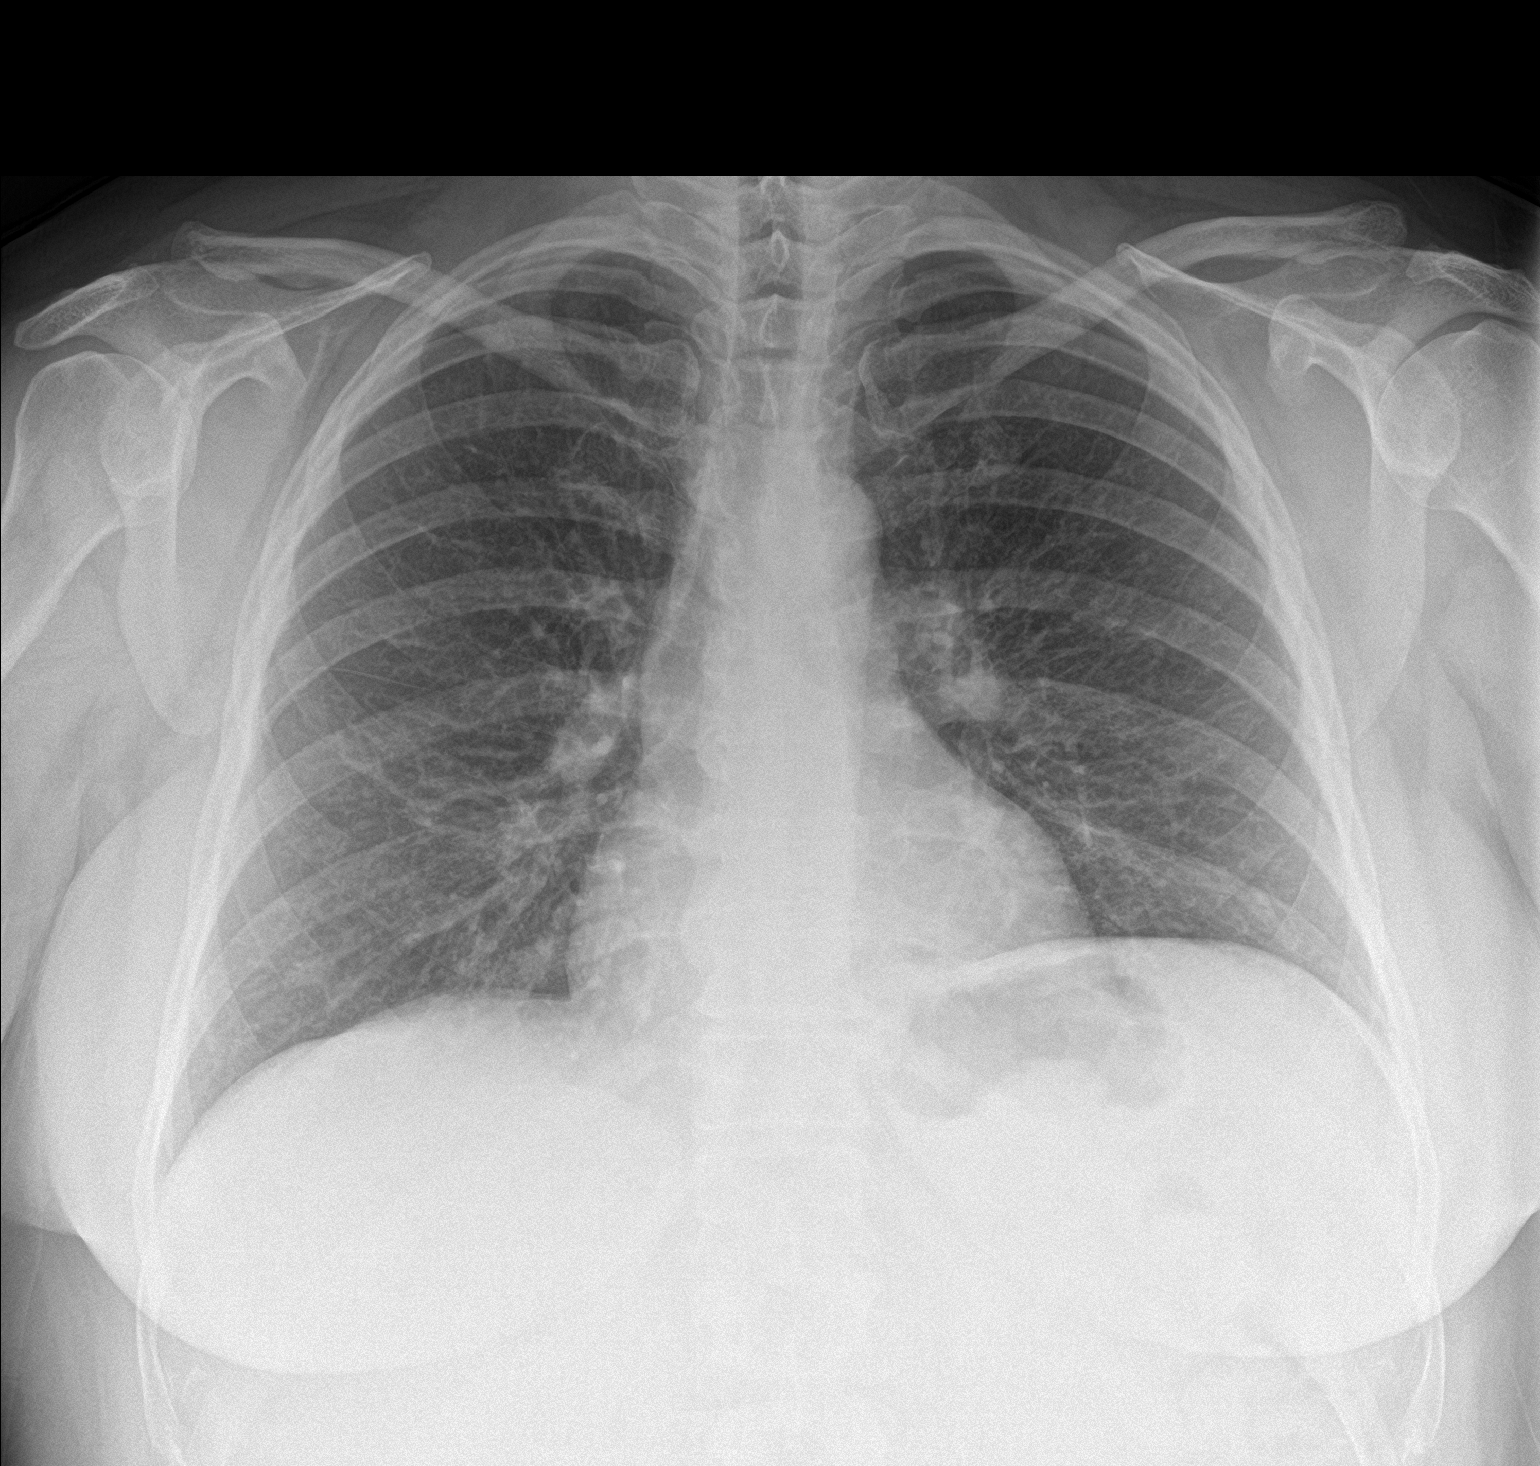

[chest lat]
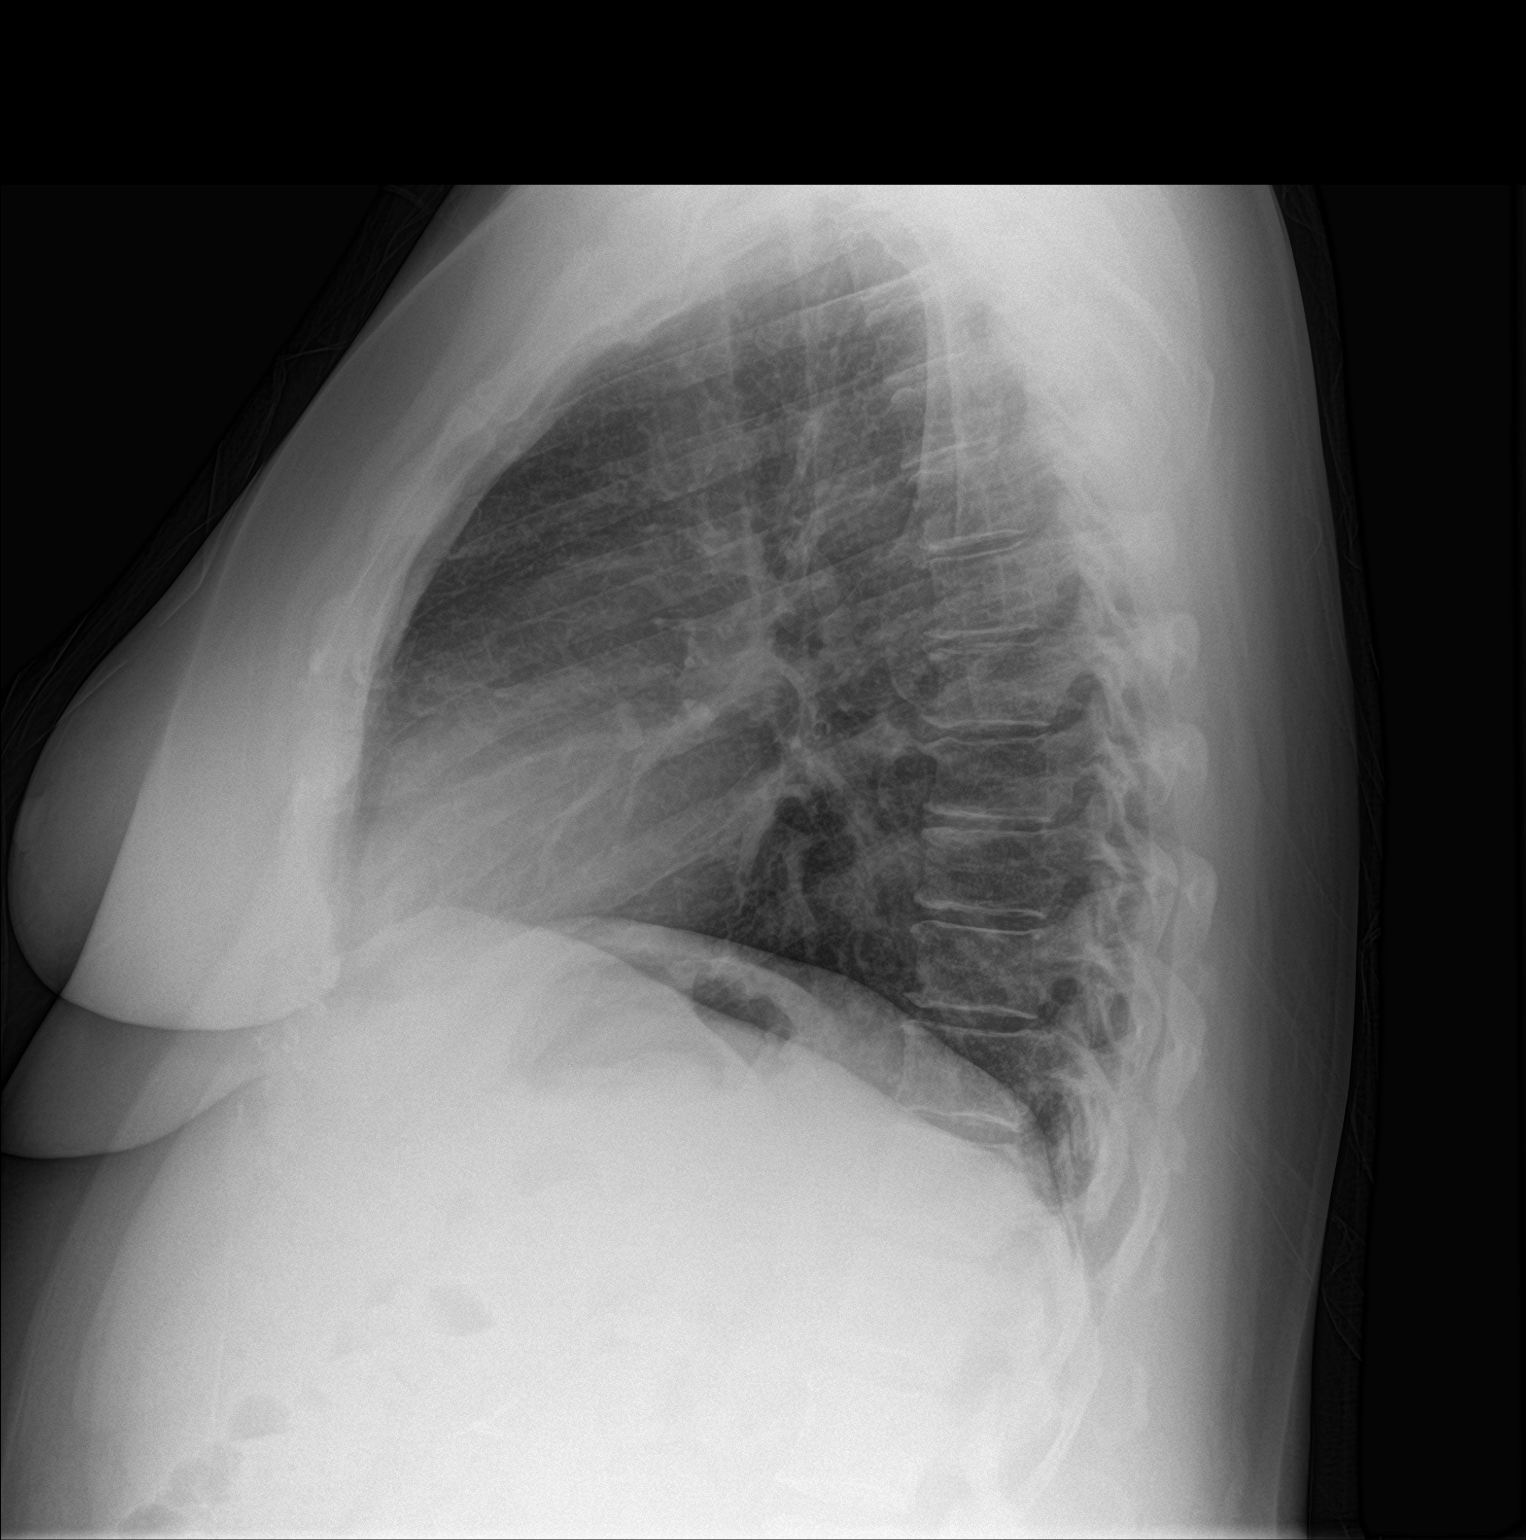

[2 of 2 positions shown; findings below may reference images not displayed]

FINDINGS: Cardiac silhouette is normal in size and configuration. Normal
mediastinal and hilar contours.

Clear lungs.  No pleural effusion or pneumothorax.

Skeletal structures are intact.
IMPRESSION: No active cardiopulmonary disease.

## 2023-11-26 ENCOUNTER — Encounter: Payer: Self-pay | Admitting: Pulmonary Disease

## 2023-12-01 ENCOUNTER — Other Ambulatory Visit: Payer: Self-pay | Admitting: Family

## 2023-12-01 DIAGNOSIS — Z1231 Encounter for screening mammogram for malignant neoplasm of breast: Secondary | ICD-10-CM

## 2023-12-05 ENCOUNTER — Ambulatory Visit
Admission: RE | Admit: 2023-12-05 | Discharge: 2023-12-05 | Disposition: A | Discharge status: Home / Self Care | Source: Ambulatory Visit | Attending: Family | Admitting: Family

## 2023-12-05 DIAGNOSIS — Z1231 Encounter for screening mammogram for malignant neoplasm of breast: Secondary | ICD-10-CM
